# Patient Record
Sex: Female | Born: 1985
Health system: Southern US, Community
[De-identification: ages and names within clinical notes are randomized; demographics above are authoritative.]

## PROBLEM LIST (undated history)

## (undated) DIAGNOSIS — E669 Obesity, unspecified: Secondary | ICD-10-CM

## (undated) DIAGNOSIS — D649 Anemia, unspecified: Secondary | ICD-10-CM

## (undated) DIAGNOSIS — N200 Calculus of kidney: Secondary | ICD-10-CM

## (undated) DIAGNOSIS — N750 Cyst of Bartholin's gland: Secondary | ICD-10-CM

## (undated) DIAGNOSIS — J45909 Unspecified asthma, uncomplicated: Secondary | ICD-10-CM

## (undated) HISTORY — PX: CHOLECYSTECTOMY: SHX55

## (undated) HISTORY — PX: KIDNEY STONE SURGERY: SHX686

---

## 2011-02-25 ENCOUNTER — Emergency Department (HOSPITAL_BASED_OUTPATIENT_CLINIC_OR_DEPARTMENT_OTHER)
Admission: EM | Admit: 2011-02-25 | Discharge: 2011-02-25 | Disposition: A | Discharge status: Home / Self Care | Payer: Self-pay | Attending: Emergency Medicine | Admitting: Emergency Medicine

## 2011-02-25 ENCOUNTER — Encounter: Payer: Self-pay | Admitting: *Deleted

## 2011-02-25 DIAGNOSIS — N390 Urinary tract infection, site not specified: Secondary | ICD-10-CM | POA: Insufficient documentation

## 2011-02-25 DIAGNOSIS — R109 Unspecified abdominal pain: Secondary | ICD-10-CM | POA: Insufficient documentation

## 2011-02-25 DIAGNOSIS — IMO0001 Reserved for inherently not codable concepts without codable children: Secondary | ICD-10-CM | POA: Insufficient documentation

## 2011-02-25 LAB — DIFFERENTIAL
Basophils Relative: 0 % (ref 0–1)
Eosinophils Absolute: 0.2 10*3/uL (ref 0.0–0.7)
Eosinophils Relative: 2 % (ref 0–5)
Monocytes Absolute: 0.7 10*3/uL (ref 0.1–1.0)
Monocytes Relative: 8 % (ref 3–12)
Neutrophils Relative %: 64 % (ref 43–77)

## 2011-02-25 LAB — COMPREHENSIVE METABOLIC PANEL
Albumin: 3.4 g/dL — ABNORMAL LOW (ref 3.5–5.2)
BUN: 8 mg/dL (ref 6–23)
Calcium: 9.4 mg/dL (ref 8.4–10.5)
Creatinine, Ser: 0.5 mg/dL (ref 0.50–1.10)
GFR calc Af Amer: >60 mL/min (ref >60)
Glucose, Bld: 92 mg/dL (ref 70–99)
Total Protein: 7.9 g/dL (ref 6.0–8.3)

## 2011-02-25 LAB — URINALYSIS, ROUTINE W REFLEX MICROSCOPIC
Bilirubin Urine: NEGATIVE
Glucose, UA: NEGATIVE mg/dL
Ketones, ur: NEGATIVE mg/dL
Protein, ur: NEGATIVE mg/dL

## 2011-02-25 LAB — CBC
HCT: 36.4 % (ref 36.0–46.0)
Hemoglobin: 12.4 g/dL (ref 12.0–15.0)
MCH: 29.2 pg (ref 26.0–34.0)
MCHC: 34.1 g/dL (ref 30.0–36.0)
MCV: 85.6 fL (ref 78.0–100.0)

## 2011-02-25 LAB — LIPASE, BLOOD: Lipase: 16 U/L (ref 11–59)

## 2011-02-25 LAB — URINE MICROSCOPIC-ADD ON

## 2011-02-25 MED ORDER — METRONIDAZOLE 500 MG PO TABS: 500.0000 mg | ORAL_TABLET | Freq: Two times a day (BID) | ORAL | Status: AC

## 2011-02-25 MED ORDER — CIPROFLOXACIN HCL 500 MG PO TABS: 500.0000 mg | ORAL_TABLET | Freq: Two times a day (BID) | ORAL | Status: AC

## 2011-02-25 MED ORDER — OXYCODONE-ACETAMINOPHEN 5-325 MG PO TABS
2.0000 | ORAL_TABLET | Freq: Once | ORAL | Status: AC
  Administered 2011-02-25: 2 via ORAL
  Filled 2011-02-25: qty 2

## 2011-02-25 MED ORDER — OXYCODONE-ACETAMINOPHEN 5-325 MG PO TABS: 2.0000 | ORAL_TABLET | ORAL | Status: AC | PRN

## 2011-02-25 MED ORDER — METRONIDAZOLE 500 MG PO TABS
1000.0000 mg | ORAL_TABLET | Freq: Once | ORAL | Status: AC
  Administered 2011-02-25: 1000 mg via ORAL
  Filled 2011-02-25: qty 2

## 2011-02-25 NOTE — ED Provider Notes (Signed)
History    Scribed for No att. providers found, the patient was seen in room MH11/MH11. This chart was scribed by Katha Cabal. This patient's care was started at 7:20PM.   CSN: [id number] Arrival date & time: 02/25/2011  7:12 PM  Chief Complaint  Patient presents with  . Abdominal Pain   Patient is a 25 y.o. female presenting with abdominal pain.  Abdominal Pain The primary symptoms of the illness include abdominal pain.   Sonia Key is a 25 y.o. female who presents to the Emergency Department complaining of moderate constant abdominal pain in all quadrants onset 7AM with associated  Myalgia, and dry heaves.  Denies n/v/d,  Pregnancy (LMP 01/23/11 takes OCP), abnormal vaginal bleeding, dysuria.  Notes cholecystectomy in 2007 with no complications.  Pt is sexually active.     HPI ELEMENTS:  Location: abdominal pain in all 4 quadrants   Onset: 7AM Timing: constant Quality: sharp  Severity: moderate  Context:  as above  Associated symptoms:  Myalgia, and dry heaves.  Denies n/v/d, and pregnancy   PAST MEDICAL HISTORY:  History reviewed. No pertinent past medical history.  PAST SURGICAL HISTORY:  Past Surgical History  Procedure Date  . Cholecystectomy   . Cesarean section     MEDICATIONS:  Previous Medications   ASPIRIN-ACETAMINOPHEN-CAFFEINE (GOODY HEADACHE PO)    Take 1 Package by mouth every 6 (six) hours as needed. pain    ETONOGESTREL (IMPLANON [place])    Inject into the skin once.       ALLERGIES:  Allergies as of 02/25/2011  . (No Known Allergies)     FAMILY HISTORY:  History reviewed. No pertinent family history.   SOCIAL HISTORY: History   Social History  . Marital Status: Single    Spouse Name: N/A    Number of Children: N/A  . Years of Education: N/A   Social History Main Topics  . Smoking status: Never Smoker   . Smokeless tobacco: None  . Alcohol Use: No  . Drug Use:   . Sexually Active: Yes    Birth Control/ Protection: Surgical   Other  Topics Concern  . None   Social History Narrative  . None    Review of Systems  Gastrointestinal: Positive for abdominal pain.   10 Systems reviewed and are negative for acute change except as noted in the HPI.  Physical Exam  BP 143/71  Pulse 62  Temp 97.8 F (36.6 C)  Resp 16  Wt 340 lb (154.223 kg)  SpO2 100%  Physical Exam  Constitutional: She is oriented to person, place, and time. She appears well-developed and well-nourished.  HENT:  Head: Normocephalic and atraumatic.  Neck: Normal range of motion. Neck supple.  Cardiovascular: Normal rate, regular rhythm and normal heart sounds.  Exam reveals no gallop and no friction rub.   No murmur heard. Pulmonary/Chest: Effort normal and breath sounds normal. No respiratory distress. She has no wheezes.  Abdominal: Soft. Bowel sounds are normal. There is tenderness. There is CVA tenderness. There is no rebound and no guarding.       Tenderness in all 4 quadrants   Musculoskeletal: Normal range of motion.  Neurological: She is alert and oriented to person, place, and time.  Skin: Skin is warm and dry.  Psychiatric: She has a normal mood and affect. Her behavior is normal.    ED Course  Procedures OTHER DATA REVIEWED: Nursing notes, vital signs, and past medical records reviewed.    DIAGNOSTIC STUDIES: Oxygen Saturation is  98%  on room air, normal by my interpretation.     LABS / RADIOLOGY:  Results for orders placed during the hospital encounter of 02/25/11  URINALYSIS, ROUTINE W REFLEX MICROSCOPIC      Component Value Range   Color, Urine YELLOW  YELLOW    Appearance CLOUDY (*) CLEAR    Specific Gravity, Urine 1.011  1.005 - 1.030    pH 6.5  5.0 - 8.0    Glucose, UA NEGATIVE  NEGATIVE (mg/dL)   Hgb urine dipstick MODERATE (*) NEGATIVE    Bilirubin Urine NEGATIVE  NEGATIVE    Ketones, ur NEGATIVE  NEGATIVE (mg/dL)   Protein, ur NEGATIVE  NEGATIVE (mg/dL)   Urobilinogen, UA 1.0  0.0 - 1.0 (mg/dL)   Nitrite  NEGATIVE  NEGATIVE    Leukocytes, UA LARGE (*) NEGATIVE   PREGNANCY, URINE      Component Value Range   Preg Test, Ur NEGATIVE    CBC      Component Value Range   WBC 8.8  4.0 - 10.5 (K/uL)   RBC 4.25  3.87 - 5.11 (MIL/uL)   Hemoglobin 12.4  12.0 - 15.0 (g/dL)   HCT 72.5  36.6 - 44.0 (%)   MCV 85.6  78.0 - 100.0 (fL)   MCH 29.2  26.0 - 34.0 (pg)   MCHC 34.1  30.0 - 36.0 (g/dL)   RDW 34.7  42.5 - 95.6 (%)   Platelets 358  150 - 400 (K/uL)  DIFFERENTIAL      Component Value Range   Neutrophils Relative 64  43 - 77 (%)   Neutro Abs 5.6  1.7 - 7.7 (K/uL)   Lymphocytes Relative 26  12 - 46 (%)   Lymphs Abs 2.3  0.7 - 4.0 (K/uL)   Monocytes Relative 8  3 - 12 (%)   Monocytes Absolute 0.7  0.1 - 1.0 (K/uL)   Eosinophils Relative 2  0 - 5 (%)   Eosinophils Absolute 0.2  0.0 - 0.7 (K/uL)   Basophils Relative 0  0 - 1 (%)   Basophils Absolute 0.0  0.0 - 0.1 (K/uL)  COMPREHENSIVE METABOLIC PANEL      Component Value Range   Sodium 139  135 - 145 (mEq/L)   Potassium 3.5  3.5 - 5.1 (mEq/L)   Chloride 103  96 - 112 (mEq/L)   CO2 27  19 - 32 (mEq/L)   Glucose, Bld 92  70 - 99 (mg/dL)   BUN 8  6 - 23 (mg/dL)   Creatinine, Ser 3.87  0.50 - 1.10 (mg/dL)   Calcium 9.4  8.4 - 56.4 (mg/dL)   Total Protein 7.9  6.0 - 8.3 (g/dL)   Albumin 3.4 (*) 3.5 - 5.2 (g/dL)   AST 15  0 - 37 (U/L)   ALT 15  0 - 35 (U/L)   Alkaline Phosphatase 102  39 - 117 (U/L)   Total Bilirubin 0.3  0.3 - 1.2 (mg/dL)   GFR calc non Af Amer >60  >60 (mL/min)   GFR calc Af Amer >60  >60 (mL/min)  LIPASE, BLOOD      Component Value Range   Lipase 16  11 - 59 (U/L)  URINE MICROSCOPIC-ADD ON      Component Value Range   Squamous Epithelial / LPF FEW (*) RARE    WBC, UA 21-50  <3 (WBC/hpf)   RBC / HPF 7-10  <3 (RBC/hpf)   Bacteria, UA FEW (*) RARE    Urine-Other TRICHOMONAS PRESENT  No results found.  No results found.   ED COURSE / COORDINATION OF CARE: Orders Placed This Encounter  Procedures  .  Urinalysis with microscopic  . Pregnancy, urine  . CBC  . Differential  . Comprehensive metabolic panel  . Lipase, blood  . Urine microscopic-add on    MDM: Urine shows trich.  Will treat for this and uti.     IMPRESSION: Diagnoses that have been ruled out:  Diagnoses that are still under consideration:  Final diagnoses:  Abdominal pain, unspecified site  Urinary tract infection    PLAN:  Home Advised to return for worsening or additional problems such as abdominal or chest pain The patient is to return the emergency department if there is any worsening of symptoms. I have reviewed the discharge instructions with the patient.     CONDITION ON DISCHARGE: Good   MEDICATIONS GIVEN IN THE E.D.  Medications  Aspirin-Acetaminophen-Caffeine (GOODY HEADACHE PO) (not administered)  Etonogestrel (IMPLANON [place]) (not administered)  ciprofloxacin (CIPRO) 500 MG tablet (not administered)  metroNIDAZOLE (FLAGYL) 500 MG tablet (not administered)  oxyCODONE-acetaminophen (PERCOCET) 5-325 MG per tablet (not administered)  metroNIDAZOLE (FLAGYL) tablet 1,000 mg (1000 mg Oral Given 02/25/11 2126)  oxyCODONE-acetaminophen (PERCOCET) 5-325 MG per tablet 2 tablet (2 tablet Oral Given 02/25/11 2126)     DISCHARGE MEDICATIONS: New Prescriptions   CIPROFLOXACIN (CIPRO) 500 MG TABLET    Take 1 tablet (500 mg total) by mouth 2 (two) times daily.   METRONIDAZOLE (FLAGYL) 500 MG TABLET    Take 1 tablet (500 mg total) by mouth 2 (two) times daily.   OXYCODONE-ACETAMINOPHEN (PERCOCET) 5-325 MG PER TABLET    Take 2 tablets by mouth every 4 (four) hours as needed for pain.       I personally performed the services described in this documentation, which was scribed in my presence. The recorded information has been reviewed and considered. No att. providers found    Geoffery Lyons, MD 02/25/11 2358

## 2011-02-25 NOTE — ED Notes (Signed)
Pt c/o generalized abd pain . Denies n/v/d

## 2015-06-11 ENCOUNTER — Encounter (HOSPITAL_BASED_OUTPATIENT_CLINIC_OR_DEPARTMENT_OTHER): Payer: Self-pay | Admitting: Emergency Medicine

## 2015-06-11 ENCOUNTER — Emergency Department (HOSPITAL_BASED_OUTPATIENT_CLINIC_OR_DEPARTMENT_OTHER)
Admission: EM | Admit: 2015-06-11 | Discharge: 2015-06-11 | Disposition: A | Discharge status: Home / Self Care | Payer: Self-pay | Attending: Emergency Medicine | Admitting: Emergency Medicine

## 2015-06-11 DIAGNOSIS — Z3202 Encounter for pregnancy test, result negative: Secondary | ICD-10-CM | POA: Insufficient documentation

## 2015-06-11 DIAGNOSIS — B3731 Acute candidiasis of vulva and vagina: Secondary | ICD-10-CM

## 2015-06-11 DIAGNOSIS — N760 Acute vaginitis: Secondary | ICD-10-CM | POA: Insufficient documentation

## 2015-06-11 DIAGNOSIS — B373 Candidiasis of vulva and vagina: Secondary | ICD-10-CM | POA: Insufficient documentation

## 2015-06-11 DIAGNOSIS — B9689 Other specified bacterial agents as the cause of diseases classified elsewhere: Secondary | ICD-10-CM

## 2015-06-11 LAB — PREGNANCY, URINE: PREG TEST UR: NEGATIVE

## 2015-06-11 LAB — URINALYSIS, ROUTINE W REFLEX MICROSCOPIC
Bilirubin Urine: NEGATIVE
GLUCOSE, UA: NEGATIVE mg/dL
Ketones, ur: NEGATIVE mg/dL
Leukocytes, UA: NEGATIVE
Nitrite: NEGATIVE
PH: 6.5 (ref 5.0–8.0)
Protein, ur: NEGATIVE mg/dL
SPECIFIC GRAVITY, URINE: 1.017 (ref 1.005–1.030)

## 2015-06-11 LAB — WET PREP, GENITAL
SPERM: NONE SEEN
Trich, Wet Prep: NONE SEEN

## 2015-06-11 LAB — URINE MICROSCOPIC-ADD ON

## 2015-06-11 MED ORDER — FLUCONAZOLE 100 MG PO TABS
200.0000 mg | ORAL_TABLET | Freq: Once | ORAL | Status: AC
  Administered 2015-06-11: 200 mg via ORAL
  Filled 2015-06-11: qty 2

## 2015-06-11 MED ORDER — METRONIDAZOLE 500 MG PO TABS: 500.0000 mg | ORAL_TABLET | Freq: Two times a day (BID) | ORAL | Status: DC

## 2015-06-11 MED ORDER — CLOTRIMAZOLE 1 % EX CREA: TOPICAL_CREAM | CUTANEOUS | Status: DC

## 2015-06-11 NOTE — ED Provider Notes (Signed)
CSN: [id number]     Arrival date & time 06/11/15  1139 History   First MD Initiated Contact with Patient 06/11/15 1205     Chief Complaint  Patient presents with  . Vaginal Itching     (Consider location/radiation/quality/duration/timing/severity/associated sxs/prior Treatment) HPI  She presents to the emergency department for evaluation of vaginal burning, redness and discharge. Her husband and her try a new kind of condom last night and it broke sometimes within the past week. The patient did not know until white being and saw part of the condom.  The discharge is white creamy. She has not had any fevers, nausea, vomiting, diarrhea, back pain or abdominal pain. Denies having any known allergies and denies that this has happened in the past. The condoms did not have any coating on them intended to cause sensations.    History reviewed. No pertinent past medical history. Past Surgical History  Procedure Laterality Date  . Cholecystectomy    . Cesarean section     History reviewed. No pertinent family history. Social History  Substance Use Topics  . Smoking status: Never Smoker   . Smokeless tobacco: None  . Alcohol Use: No   OB History    No data available     Review of Systems  All other systems reviewed and are negative.   Allergies  Review of patient's allergies indicates no known allergies.  Home Medications   Prior to Admission medications   Medication Sig Start Date End Date Taking? Authorizing Provider  Aspirin-Acetaminophen-Caffeine (GOODY HEADACHE PO) Take 1 Package by mouth every 6 (six) hours as needed. pain     Historical Provider, MD  clotrimazole (LOTRIMIN) 1 % cream Apply to affected area 2 times daily, not to be used internally. Only on external genitalia. 06/11/15   Corina Stacy Neva Seat, PA-C  Etonogestrel (IMPLANON [place]) Inject into the skin once.      Historical Provider, MD  metroNIDAZOLE (FLAGYL) 500 MG tablet Take 1 tablet (500 mg total) by mouth 2 (two)  times daily. 06/11/15   Dalary Hollar Neva Seat, PA-C   BP 123/93 mmHg  Pulse 79  Temp(Src) 97.7 F (36.5 C) (Oral)  Resp 18  Ht  (1.803 m)  Wt 124.739 kg  BMI 38.37 kg/m2  SpO2 100%  LMP 06/01/2015 Physical Exam  Constitutional: She appears well-developed and well-nourished. No distress.  HENT:  Head: Normocephalic and atraumatic.  Eyes: Pupils are equal, round, and reactive to light.  Neck: Normal range of motion. Neck supple.  Cardiovascular: Normal rate and regular rhythm.   Pulmonary/Chest: Effort normal.  Abdominal: Soft.  Genitourinary: There is rash on the right labia. There is no tenderness on the right labia. There is rash on the left labia. There is no tenderness on the left labia. There is erythema in the vagina. No bleeding in the vagina. No foreign body around the vagina. No signs of injury around the vagina. Vaginal discharge (white) found.  Neurological: She is alert.  Skin: Skin is warm and dry.  Nursing note and vitals reviewed.   ED Course  Procedures (including critical care time) Labs Review Labs Reviewed  WET PREP, GENITAL - Abnormal; Notable for the following:    Yeast Wet Prep HPF POC PRESENT (*)    Clue Cells Wet Prep HPF POC PRESENT (*)    WBC, Wet Prep HPF POC MANY (*)    All other components within normal limits  URINALYSIS, ROUTINE W REFLEX MICROSCOPIC (NOT AT [facility]) - Abnormal; Notable for the following:  Hgb urine dipstick TRACE (*)    All other components within normal limits  URINE MICROSCOPIC-ADD ON - Abnormal; Notable for the following:    Squamous Epithelial / LPF 0-5 (*)    Bacteria, UA RARE (*)    All other components within normal limits  PREGNANCY, URINE  GC/CHLAMYDIA PROBE AMP ([place]) [facility]    Imaging Review No results found. I have personally reviewed and evaluated these images and lab results as part of my medical decision-making.   EKG Interpretation None      MDM   Final diagnoses:  Vaginal yeast  infection  Bacterial vaginosis   Wet prep shows positive yeast and clue cells as well as MANY WBC. A dose of Diflucan given in the ED. rx Metronidazole and topical antifungal for itching to external genitalia.  [facility]  Medications  fluconazole (DIFLUCAN) tablet 200 mg (not administered)     I feel the patient has had an appropriate workup for their chief complaint at this time and likelihood of emergent condition existing is low. Discussed s/sx that warrant return to the ED.  Filed Vitals:   06/11/15 1144  BP: 123/93  Pulse: 79  Temp: 97.7 F (36.5 C)  Resp: [address]       Jesusa Stenerson, PA-C 06/11/15 1319  Arby BarretteMarcy Pfeiffer, MD 06/11/15 1530

## 2015-06-11 NOTE — ED Notes (Signed)
Pt reports that she and her husband used a different type of condom, apparently it broke inside her she was unaware, this am tip of condom came out while wiping, now pt has burning  redness and discharge

## 2015-06-11 NOTE — Discharge Instructions (Signed)
Bacterial Vaginosis °Bacterial vaginosis is a vaginal infection that occurs when the normal balance of bacteria in the vagina is disrupted. It results from an overgrowth of certain bacteria. This is the most common vaginal infection in women of childbearing age. Treatment is important to prevent complications, especially in pregnant women, as it can cause a premature delivery. °CAUSES  °Bacterial vaginosis is caused by an increase in harmful bacteria that are normally present in smaller amounts in the vagina. Several different kinds of bacteria can cause bacterial vaginosis. However, the reason that the condition develops is not fully understood. °RISK FACTORS °Certain activities or behaviors can put you at an increased risk of developing bacterial vaginosis, including: °· Having a new sex partner or multiple sex partners. °· Douching. °· Using an intrauterine device (IUD) for contraception. °Women do not get bacterial vaginosis from toilet seats, bedding, swimming pools, or contact with objects around them. °SIGNS AND SYMPTOMS  °Some women with bacterial vaginosis have no signs or symptoms. Common symptoms include: °· Grey vaginal discharge. °· A fishlike odor with discharge, especially after sexual intercourse. °· Itching or burning of the vagina and vulva. °· Burning or pain with urination. °DIAGNOSIS  °Your health care provider will take a medical history and examine the vagina for signs of bacterial vaginosis. A sample of vaginal fluid may be taken. Your health care provider will look at this sample under a microscope to check for bacteria and abnormal cells. A vaginal pH test may also be done.  °TREATMENT  °Bacterial vaginosis may be treated with antibiotic medicines. These may be given in the form of a pill or a vaginal cream. A second round of antibiotics may be prescribed if the condition comes back after treatment. Because bacterial vaginosis increases your risk for sexually transmitted diseases, getting  treated can help reduce your risk for chlamydia, gonorrhea, HIV, and herpes. °HOME CARE INSTRUCTIONS  °· Only take over-the-counter or prescription medicines as directed by your health care provider. °· If antibiotic medicine was prescribed, take it as directed. Make sure you finish it even if you start to feel better. °· Tell all sexual partners that you have a vaginal infection. They should see their health care provider and be treated if they have problems, such as a mild rash or itching. °· During treatment, it is important that you follow these instructions: °¨ Avoid sexual activity or use condoms correctly. °¨ Do not douche. °¨ Avoid alcohol as directed by your health care provider. °¨ Avoid breastfeeding as directed by your health care provider. °SEEK MEDICAL CARE IF:  °· Your symptoms are not improving after 3 days of treatment. °· You have increased discharge or pain. °· You have a fever. °MAKE SURE YOU:  °· Understand these instructions. °· Will watch your condition. °· Will get help right away if you are not doing well or get worse. °FOR MORE INFORMATION  °Centers for Disease Control and Prevention, Division of STD Prevention: www.cdc.gov/std °American Sexual Health Association (ASHA): www.ashastd.org  °  °This information is not intended to replace advice given to you by your health care provider. Make sure you discuss any questions you have with your health care provider. °  °Document Released: 06/24/2005 Document Revised: 07/15/2014 Document Reviewed: 02/03/2013 °Elsevier Interactive Patient Education ©2016 [employer] ° ° ° °Vaginitis °Vaginitis is an inflammation of the vagina. It is most often caused by a change in the normal balance of the bacteria and yeast that live in the vagina. This change in balance   causes an overgrowth of certain bacteria or yeast, which causes the inflammation. There are different types of vaginitis, but the most common types are: °· Bacterial vaginosis. °· Yeast  infection (candidiasis). °· Trichomoniasis vaginitis. This is a sexually transmitted infection (STI). °· Viral vaginitis. °· Atrophic vaginitis. °· Allergic vaginitis. °CAUSES  °The cause depends on the type of vaginitis. Vaginitis can be caused by: °· Bacteria (bacterial vaginosis). °· Yeast (yeast infection). °· A parasite (trichomoniasis vaginitis) °· A virus (viral vaginitis). °· Low hormone levels (atrophic vaginitis). Low hormone levels can occur during pregnancy, breastfeeding, or after menopause. °· Irritants, such as bubble baths, scented tampons, and feminine sprays (allergic vaginitis). °Other factors can change the normal balance of the yeast and bacteria that live in the vagina. These include: °· Antibiotic medicines. °· Poor hygiene. °· Diaphragms, vaginal sponges, spermicides, birth control pills, and intrauterine devices (IUD). °· Sexual intercourse. °· Infection. °· Uncontrolled diabetes. °· A weakened immune system. °SYMPTOMS  °Symptoms can vary depending on the cause of the vaginitis. Common symptoms include: °· Abnormal vaginal discharge. °¨ The discharge is white, gray, or yellow with bacterial vaginosis. °¨ The discharge is thick, white, and cheesy with a yeast infection. °¨ The discharge is frothy and yellow or greenish with trichomoniasis. °· A bad vaginal odor. °¨ The odor is fishy with bacterial vaginosis. °· Vaginal itching, pain, or swelling. °· Painful intercourse. °· Pain or burning when urinating. °Sometimes, there are no symptoms. °TREATMENT  °Treatment will vary depending on the type of infection.  °· Bacterial vaginosis and trichomoniasis are often treated with antibiotic creams or pills. °· Yeast infections are often treated with antifungal medicines, such as vaginal creams or suppositories. °· Viral vaginitis has no cure, but symptoms can be treated with medicines that relieve discomfort. Your sexual partner should be treated as well. °· Atrophic vaginitis may be treated with an  estrogen cream, pill, suppository, or vaginal ring. If vaginal dryness occurs, lubricants and moisturizing creams may help. You may be told to avoid scented soaps, sprays, or douches. °· Allergic vaginitis treatment involves quitting the use of the product that is causing the problem. Vaginal creams can be used to treat the symptoms. °HOME CARE INSTRUCTIONS  °· Take all medicines as directed by your caregiver. °· Keep your genital area clean and dry. Avoid soap and only rinse the area with water. °· Avoid douching. It can remove the healthy bacteria in the vagina. °· Do not use tampons or have sexual intercourse until your vaginitis has been treated. Use sanitary pads while you have vaginitis. °· Wipe from front to back. This avoids the spread of bacteria from the rectum to the vagina. °· Let air reach your genital area. °¨ Wear cotton underwear to decrease moisture buildup. °¨ Avoid wearing underwear while you sleep until your vaginitis is gone. °¨ Avoid tight pants and underwear or nylons without a cotton panel. °¨ Take off wet clothing (especially bathing suits) as soon as possible. °· Use mild, non-scented products. Avoid using irritants, such as: °¨ Scented feminine sprays. °¨ Fabric softeners. °¨ Scented detergents. °¨ Scented tampons. °¨ Scented soaps or bubble baths. °· Practice safe sex and use condoms. Condoms may prevent the spread of trichomoniasis and viral vaginitis. °SEEK MEDICAL CARE IF:  °· You have abdominal pain. °· You have a fever or persistent symptoms for more than 2-3 days. °· You have a fever and your symptoms suddenly get worse. °  °This information is not intended to replace advice given   to you by your health care provider. Make sure you discuss any questions you have with your health care provider. °  °Document Released: 04/21/2007 Document Revised: 11/08/2014 Document Reviewed: 12/05/2011 °Elsevier Interactive Patient Education ©2016 [employer] Inc. ° °

## 2015-06-12 LAB — GC/CHLAMYDIA PROBE AMP (~~LOC~~) NOT AT ARMC
Chlamydia: NEGATIVE
NEISSERIA GONORRHEA: NEGATIVE

## 2015-12-31 ENCOUNTER — Emergency Department (HOSPITAL_BASED_OUTPATIENT_CLINIC_OR_DEPARTMENT_OTHER)
Admission: EM | Admit: 2015-12-31 | Discharge: 2015-12-31 | Disposition: A | Discharge status: Home / Self Care | Payer: Self-pay | Attending: Emergency Medicine | Admitting: Emergency Medicine

## 2015-12-31 ENCOUNTER — Encounter (HOSPITAL_BASED_OUTPATIENT_CLINIC_OR_DEPARTMENT_OTHER): Payer: Self-pay | Admitting: *Deleted

## 2015-12-31 DIAGNOSIS — N751 Abscess of Bartholin's gland: Secondary | ICD-10-CM | POA: Insufficient documentation

## 2015-12-31 DIAGNOSIS — Z7982 Long term (current) use of aspirin: Secondary | ICD-10-CM | POA: Insufficient documentation

## 2015-12-31 DIAGNOSIS — F1290 Cannabis use, unspecified, uncomplicated: Secondary | ICD-10-CM | POA: Insufficient documentation

## 2015-12-31 MED ORDER — LIDOCAINE-EPINEPHRINE (PF) 2 %-1:200000 IJ SOLN
10.0000 mL | Freq: Once | INTRAMUSCULAR | Status: AC
  Administered 2015-12-31: 10 mL
  Filled 2015-12-31: qty 10

## 2015-12-31 NOTE — ED Notes (Signed)
Pt reports a L lower labial abscess x2wks that's gradually gotten worse. Denies drainage, fever, n/v/d. Denies known exposure to STDs.

## 2015-12-31 NOTE — ED Provider Notes (Signed)
CSN: [id number]     Arrival date & time 12/31/15  [id number] History   First MD [id number] Contact with Patient 12/31/15 0901     Chief Complaint  Patient presents with  . Abscess     (Consider location/radiation/quality/duration/timing/severity/associated sxs/prior Treatment) HPI   Patient is a 30 year old female with no pertinent past medical history presents the ED with complaint of labial abscess, onset 2 weeks. Patient reports after shaving 2 weeks ago she began to have an ingrown hair which she notes has increased in size over the past week. Endorses associated pain. Denies fever, chills, drainage, dysuria, vaginal d/c, vaginal bleeding.  History reviewed. No pertinent past medical history. Past Surgical History  Procedure Laterality Date  . Cholecystectomy    . Cesarean section     No family history on file. Social History  Substance Use Topics  . Smoking status: Never Smoker   . Smokeless tobacco: Never Used  . Alcohol Use: Yes     Comment: once a month   OB History    No data available     Review of Systems  Constitutional: Negative for fever.  Gastrointestinal: Negative for nausea, vomiting and abdominal pain.  Genitourinary: Negative for dysuria, vaginal bleeding and vaginal discharge.  Skin: Wound: abscess.      Allergies  Review of patient's allergies indicates no known allergies.  Home Medications   Prior to Admission medications   Medication Sig Start Date End Date Taking? Authorizing Provider  Aspirin-Acetaminophen-Caffeine (GOODY HEADACHE PO) Take 1 Package by mouth every 6 (six) hours as needed. pain    Yes Historical Provider, MD  clotrimazole (LOTRIMIN) 1 % cream Apply to affected area 2 times daily, not to be used internally. Only on external genitalia. 06/11/15   Tiffany Neva SeatGreene, PA-C  Etonogestrel (IMPLANON [place]) Inject into the skin once.      Historical Provider, MD  metroNIDAZOLE (FLAGYL) 500 MG tablet Take 1 tablet (500 mg total) by mouth 2 (two)  times daily. 06/11/15   Tiffany Neva SeatGreene, PA-C   BP 129/74 mmHg  Pulse 66  Temp(Src) 98 F (36.7 C) (Oral)  Resp 20  Ht 5\' 11"  (1.803 m)  Wt 129.275 kg  BMI 39.77 kg/m2  SpO2 99%  LMP 12/09/2015 Physical Exam  Constitutional: She is oriented to person, place, and time. She appears well-developed and well-nourished.  HENT:  Head: Normocephalic and atraumatic.  Eyes: Conjunctivae and EOM are normal. Right eye exhibits no discharge. Left eye exhibits no discharge. No scleral icterus.  Neck: Normal range of motion. Neck supple.  Cardiovascular: Normal rate.   Pulmonary/Chest: Effort normal.  Abdominal: Soft. She exhibits no distension.  Genitourinary:     Musculoskeletal: Normal range of motion.  Neurological: She is alert and oriented to person, place, and time.  Skin: Skin is warm and dry.  Nursing note and vitals reviewed.   ED Course  .Marland Kitchen.Incision and Drainage Date/Time: 12/31/2015 9:55 AM Performed by: Barrett HenleNADEAU, NICOLE ELIZABETH Authorized by: Barrett HenleNADEAU, NICOLE ELIZABETH Consent: Verbal consent obtained. Risks and benefits: risks, benefits and alternatives were discussed Consent given by: patient Patient understanding: patient states understanding of the procedure being performed Patient identity confirmed: verbally with patient Type: abscess Body area: anogenital Location details: Bartholin's gland Anesthesia: local infiltration Local anesthetic: lidocaine 2% with epinephrine Anesthetic total: 3 ml Scalpel size: 11 Needle gauge: 25. Incision type: single straight Incision depth: dermal Complexity: simple Drainage: purulent Drainage amount: moderate Wound treatment: wound left open Packing material: 1/2 in gauze Patient tolerance: Patient tolerated the  procedure well with no immediate complications   (including critical care time) Labs Review Labs Reviewed - No data to display  Imaging Review No results found. I have personally reviewed and evaluated these images  and lab results as part of my medical decision-making.   EKG Interpretation None      MDM   Final diagnoses:  Bartholin's gland abscess    Patient with bartholin's abscess amenable to incision and drainage.  Abscess was not large enough to warrant packing or drain,  wound recheck in 2 days. Encouraged home warm soaks and flushing.  Mild signs of cellulitis is surrounding skin.  Will d/c to home.  No antibiotic therapy is indicated. Discussed return precautions with patient.    Satira Sarkicole Elizabeth WedoweeNadeau, [place] JerseyPA-C 12/31/15 [phone]  Rolan BuccoMelanie Belfi, MD 12/31/15 1034

## 2015-12-31 NOTE — Discharge Instructions (Signed)
Keep ear wounds clean using Dial antibacterial supper and water, pat dry. I recommend continuing to flush the wound with water after bathing and keep the wound clean after urinating. I recommend continuing to apply warm compresses to wound for 15-20 minutes 3-4 times daily. Please follow up with a primary care provider from the Resource Guide provided below in 4-5 days if your symptoms have not improved. Please return to the Emergency Department if symptoms worsen or new onset of fever, abdominal pain, pain with urinating, vaginal discharge, drainage, swelling, redness.

## 2016-02-06 ENCOUNTER — Emergency Department (HOSPITAL_BASED_OUTPATIENT_CLINIC_OR_DEPARTMENT_OTHER)
Admission: EM | Admit: 2016-02-06 | Discharge: 2016-02-06 | Disposition: A | Discharge status: Home / Self Care | Payer: Self-pay | Attending: Emergency Medicine | Admitting: Emergency Medicine

## 2016-02-06 ENCOUNTER — Encounter (HOSPITAL_BASED_OUTPATIENT_CLINIC_OR_DEPARTMENT_OTHER): Payer: Self-pay | Admitting: *Deleted

## 2016-02-06 DIAGNOSIS — J029 Acute pharyngitis, unspecified: Secondary | ICD-10-CM | POA: Insufficient documentation

## 2016-02-06 LAB — RAPID STREP SCREEN (MED CTR MEBANE ONLY): Streptococcus, Group A Screen (Direct): NEGATIVE

## 2016-02-06 MED ORDER — IBUPROFEN 800 MG PO TABS: 800.0000 mg | ORAL_TABLET | Freq: Three times a day (TID) | ORAL | 0 refills | Status: DC | PRN

## 2016-02-06 MED ORDER — KETOROLAC TROMETHAMINE 30 MG/ML IJ SOLN
30.0000 mg | Freq: Once | INTRAMUSCULAR | Status: AC
  Administered 2016-02-06: 30 mg via INTRAVENOUS
  Filled 2016-02-06: qty 1

## 2016-02-06 MED ORDER — HYDROCODONE-ACETAMINOPHEN 7.5-325 MG/15ML PO SOLN: 10.0000 mL | Freq: Four times a day (QID) | ORAL | 0 refills | Status: DC | PRN

## 2016-02-06 MED ORDER — SODIUM CHLORIDE 0.9 % IV BOLUS (SEPSIS)
1000.0000 mL | Freq: Once | INTRAVENOUS | Status: AC
  Administered 2016-02-06: 1000 mL via INTRAVENOUS

## 2016-02-06 MED FILL — IBUPROFEN 800 MG TABLET: 800 | 7 days supply | Qty: 21 | Fill #0

## 2016-02-06 MED FILL — HYDROCOD-APAP 7.5-325/15ML: 7.5-325 | 3 days supply | Qty: 100 | Fill #0

## 2016-02-06 NOTE — Discharge Instructions (Signed)
Read the information below.  Use the prescribed medication as directed.  Please discuss all new medications with your pharmacist.  Do not take additional tylenol while taking the prescribed pain medication to avoid overdose.  You may return to the Emergency Department at any time for worsening condition or any new symptoms that concern you.  If you develop high fevers, difficulty swallowing or breathing, or you are unable to tolerate fluids by mouth, return to the ER immediately for a recheck.    °

## 2016-02-06 NOTE — ED Triage Notes (Signed)
Sore throat x 3 days. Ears and eyes hurt.

## 2016-02-06 NOTE — ED Provider Notes (Signed)
[facility]   CSN: [id number] Arrival date & time: 02/06/16  1032  First Provider Contact:  First MD Initiated Contact with Patient 02/06/16 1040        History   Chief Complaint No chief complaint on file.   HPI Sonia Key is a 30 y.o. female.  HPI   Patient presents with 3 days of sore throat, headache, bilateral ear pain, chills, myalgias.  Took goody powder this morning without relief.  Is attempting to stay hydrated but has had decreased PO intake.  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    No past medical history on file.  There are no active problems to display for this patient.   Past Surgical History:  Procedure Laterality Date  . CESAREAN SECTION    . CHOLECYSTECTOMY      OB History    No data available       Home Medications    Prior to Admission medications   Medication Sig Start Date End Date Taking? Authorizing Provider  Aspirin-Acetaminophen-Caffeine (GOODY HEADACHE PO) Take 1 Package by mouth every 6 (six) hours as needed. pain     Historical Provider, MD  clotrimazole (LOTRIMIN) 1 % cream Apply to affected area 2 times daily, not to be used internally. Only on external genitalia. 06/11/15   Tiffany Neva Seat, PA-C  Etonogestrel (IMPLANON [place]) Inject into the skin once.      Historical Provider, MD  metroNIDAZOLE (FLAGYL) 500 MG tablet Take 1 tablet (500 mg total) by mouth 2 (two) times daily. 06/11/15   Marlon Pel, PA-C    Family History No family history on file.  Social History Social History  Substance Use Topics  . Smoking status: Never Smoker  . Smokeless tobacco: Never Used  . Alcohol use Yes     Comment: once a month     Allergies   Review of patient's allergies indicates no known allergies.   Review of Systems Review of Systems  All other systems reviewed and are negative.    Physical Exam Updated Vital Signs BP 143/76 (BP Location: Left Arm)   Pulse 65   Temp  98 F (36.7 C) (Oral)   Resp 18   Ht 5\' 10"  (1.778 m)   Wt 129.3 kg   SpO2 100%   BMI 40.89 kg/m   Physical Exam  Constitutional: She appears well-developed and well-nourished. No distress.  HENT:  Head: Normocephalic and atraumatic.  Mouth/Throat: Uvula is midline. Mucous membranes are not dry. Oropharyngeal exudate, posterior oropharyngeal edema and posterior oropharyngeal erythema present. No tonsillar abscesses.  Eyes: Conjunctivae are normal.  Neck: Neck supple.  Cardiovascular: Normal rate and regular rhythm.   Pulmonary/Chest: Effort normal and breath sounds normal. No stridor. No respiratory distress. She has no wheezes. She has no rales.  Lymphadenopathy:    She has cervical adenopathy.  Neurological: She is alert.  Skin: She is not diaphoretic.  Nursing note and vitals reviewed.    ED Treatments / Results  Labs (all labs ordered are listed, but only abnormal results are displayed) Labs Reviewed  RAPID STREP SCREEN (NOT AT [facility])  CULTURE, GROUP A STREP [facility])    EKG  EKG Interpretation None       Radiology No results found.  Procedures Procedures (including critical care time)  Medications Ordered in ED Medications  sodium chloride 0.9 % bolus 1,000 mL (1,000 mLs Intravenous New Bag/Given 02/06/16 1128)  ketorolac (TORADOL) 30 MG/ML injection 30  mg (30 mg Intravenous Given 02/06/16 1131)     Initial Impression / Assessment and Plan / ED Course  I have reviewed the triage vital signs and the nursing notes.  Pertinent labs & imaging results that were available during my care of the patient were reviewed by me and considered in my medical decision making (see chart for details).  Clinical Course    Afebrile, nontoxic patient with sore throat.  Strep screen negative, culture pending.  Easily tolerating oral secretions.  No airway concerns.  Doubt peritonsillar abscess.   D/C home with pain medication, PCP follow up.  Discussed result, findings,  treatment, and follow up  with patient.  Pt given return precautions.  Pt verbalizes understanding and agrees with plan.       Final Clinical Impressions(s) / ED Diagnoses   Final diagnoses:  Pharyngitis    New Prescriptions New Prescriptions   HYDROCODONE-ACETAMINOPHEN (HYCET) 7.5-325 MG/15 ML SOLUTION    Take 10 mLs by mouth 4 (four) times daily as needed for severe pain.   IBUPROFEN (ADVIL,MOTRIN) 800 MG TABLET    Take 1 tablet (800 mg total) by mouth every 8 (eight) hours as needed.     Trixie Dredge, PA-C 02/06/16 1226    Vanetta Mulders, MD 02/09/16 [phone]

## 2016-02-07 LAB — CULTURE, GROUP A STREP (THRC)

## 2016-03-13 ENCOUNTER — Encounter (HOSPITAL_BASED_OUTPATIENT_CLINIC_OR_DEPARTMENT_OTHER): Payer: Self-pay | Admitting: *Deleted

## 2016-03-13 ENCOUNTER — Emergency Department (HOSPITAL_BASED_OUTPATIENT_CLINIC_OR_DEPARTMENT_OTHER)
Admission: EM | Admit: 2016-03-13 | Discharge: 2016-03-13 | Disposition: A | Discharge status: Home / Self Care | Payer: BLUE CROSS/BLUE SHIELD | Attending: Emergency Medicine | Admitting: Emergency Medicine

## 2016-03-13 DIAGNOSIS — R109 Unspecified abdominal pain: Secondary | ICD-10-CM | POA: Diagnosis not present

## 2016-03-13 DIAGNOSIS — Z7982 Long term (current) use of aspirin: Secondary | ICD-10-CM | POA: Diagnosis not present

## 2016-03-13 DIAGNOSIS — R197 Diarrhea, unspecified: Secondary | ICD-10-CM | POA: Diagnosis not present

## 2016-03-13 HISTORY — DX: Calculus of kidney: N20.0

## 2016-03-13 LAB — URINALYSIS, ROUTINE W REFLEX MICROSCOPIC
Bilirubin Urine: NEGATIVE
GLUCOSE, UA: NEGATIVE mg/dL
HGB URINE DIPSTICK: NEGATIVE
KETONES UR: NEGATIVE mg/dL
Leukocytes, UA: NEGATIVE
Nitrite: NEGATIVE
PROTEIN: NEGATIVE mg/dL
Specific Gravity, Urine: 1.015 (ref 1.005–1.030)
pH: 6 (ref 5.0–8.0)

## 2016-03-13 LAB — COMPREHENSIVE METABOLIC PANEL
ALK PHOS: 81 U/L (ref 38–126)
ALT: 16 U/L (ref 14–54)
ANION GAP: 9 (ref 5–15)
AST: 21 U/L (ref 15–41)
Albumin: 4 g/dL (ref 3.5–5.0)
BUN: 8 mg/dL (ref 6–20)
CALCIUM: 8.8 mg/dL — AB (ref 8.9–10.3)
CO2: 29 mmol/L (ref 22–32)
Chloride: 101 mmol/L (ref 101–111)
Creatinine, Ser: 0.64 mg/dL (ref 0.44–1.00)
Glucose, Bld: 95 mg/dL (ref 65–99)
Potassium: 3.2 mmol/L — ABNORMAL LOW (ref 3.5–5.1)
SODIUM: 139 mmol/L (ref 135–145)
TOTAL PROTEIN: 7.8 g/dL (ref 6.5–8.1)
Total Bilirubin: 0.5 mg/dL (ref 0.3–1.2)

## 2016-03-13 LAB — CBC
HCT: 38.5 % (ref 36.0–46.0)
HEMOGLOBIN: 13 g/dL (ref 12.0–15.0)
MCH: 31 pg (ref 26.0–34.0)
MCHC: 33.8 g/dL (ref 30.0–36.0)
MCV: 91.7 fL (ref 78.0–100.0)
Platelets: 292 10*3/uL (ref 150–400)
RBC: 4.2 MIL/uL (ref 3.87–5.11)
RDW: 12.4 % (ref 11.5–15.5)
WBC: 5.9 10*3/uL (ref 4.0–10.5)

## 2016-03-13 LAB — OCCULT BLOOD X 1 CARD TO LAB, STOOL: FECAL OCCULT BLD: NEGATIVE

## 2016-03-13 LAB — PREGNANCY, URINE: Preg Test, Ur: NEGATIVE

## 2016-03-13 NOTE — ED Notes (Signed)
Unable to obtain d/c VS. Pt states [facility].

## 2016-03-13 NOTE — ED Notes (Signed)
Pt given d/c instructions as per chart. Verbalizes understanding. No questions. 

## 2016-03-13 NOTE — ED Provider Notes (Signed)
[facility]   CSN: [id number] Arrival date & time: 03/13/16  1728     History   Chief [id number] Chief Complaint  Patient presents with  . Abdominal Pain    HPI   Sonia Key is a 30 y.o. Female with pmhx of kidney stools presenting with abdominal pain and dark stools. States two weeks ago, patient had a rotator cuff injury. Patient was seen on Friday for her rotator cuff injury last Thursday and received  Toradol injection. She received a prescription for tramadol and took 2 pills, she did not like how it made her feel so she stopped this medication. On Friday or Saturday, patient was noted having black stools. Patient then had black stool again yesterday and today. Patient indicated having intermittent LUQ abdominal pain, not currently with pain. Not associated with food. Indicates having Ibuprofen x 1, however states this hurt her stomach and there she has not taken any other NSAIDs. Denies ETOH or tobacco uses. No bright red blood in the stools. Endorses lightheadness and fatigue. Denies palpation or SOB.       Past Medical History:  Diagnosis Date  . Kidney stones     There are no active problems to display for this patient.   Past Surgical History:  Procedure Laterality Date  . CESAREAN SECTION    . CHOLECYSTECTOMY    . KIDNEY STONE SURGERY      OB History    No data available       Home Medications    Prior to Admission medications   Medication Sig Start Date End Date Taking? Authorizing Provider  Aspirin-Acetaminophen-Caffeine (GOODY HEADACHE PO) Take 1 Package by mouth every 6 (six) hours as needed. pain     Historical Provider, MD  clotrimazole (LOTRIMIN) 1 % cream Apply to affected area 2 times daily, not to be used internally. Only on external genitalia. 06/11/15   Tiffany Neva SeatGreene, PA-C  Etonogestrel (IMPLANON [place]) Inject into the skin once.      Historical Provider, MD  HYDROcodone-acetaminophen (HYCET) 7.5-325 mg/15 ml  solution Take 10 mLs by mouth 4 (four) times daily as needed for severe pain. 02/06/16   Trixie DredgeEmily West, PA-C  ibuprofen (ADVIL,MOTRIN) 800 MG tablet Take 1 tablet (800 mg total) by mouth every 8 (eight) hours as needed. 02/06/16   Trixie DredgeEmily West, PA-C  metroNIDAZOLE (FLAGYL) 500 MG tablet Take 1 tablet (500 mg total) by mouth 2 (two) times daily. 06/11/15   Marlon Peliffany Greene, PA-C    Family History No family history on file.  Social History Social History  Substance Use Topics  . Smoking status: Never Smoker  . Smokeless tobacco: Never Used  . Alcohol use Yes     Comment: once a month     Allergies   Review of patient's allergies indicates no known allergies.   Review of Systems Review of Systems  Constitutional: Negative for chills and fever.  Eyes: Negative for visual disturbance.  Respiratory: Negative for cough and shortness of breath.   Cardiovascular: Negative for chest pain and palpitations.  Gastrointestinal: Positive for abdominal pain and diarrhea. Negative for blood in stool, nausea and vomiting.  Genitourinary: Negative for dysuria, frequency and urgency.     Physical Exam Updated Vital Signs BP (!) 150/102 (BP Location: Right Arm)   Pulse 68   Temp 98.2 F (36.8 C) (Oral)   Resp 20   Ht 5\' 10"  (1.778 m)   Wt 130.6 kg   LMP 03/07/2016   SpO2 100%  BMI 41.32 kg/m   Physical Exam  Constitutional: She is oriented to person, place, and time. She appears well-developed and well-nourished.  HENT:  Head: Normocephalic and atraumatic.  Eyes: Conjunctivae are normal. Right eye exhibits no discharge. Left eye exhibits no discharge.  Neck: Normal range of motion. Neck supple.  Cardiovascular: Normal rate, regular rhythm, normal heart sounds and intact distal pulses.   Pulmonary/Chest: Effort normal and breath sounds normal.  Abdominal: Soft. Bowel sounds are normal. There is no tenderness. There is no guarding.  Musculoskeletal: Normal range of motion.  Neurological: She  is alert and oriented to person, place, and time.  Skin: Skin is warm and dry.     ED Treatments / Results  Labs (all labs ordered are listed, but only abnormal results are displayed) Labs Reviewed  COMPREHENSIVE METABOLIC PANEL - Abnormal; Notable for the following:       Result Value   Potassium 3.2 (*)    Calcium 8.8 (*)    All other components within normal limits  URINALYSIS, ROUTINE W REFLEX MICROSCOPIC (NOT AT [facility])  PREGNANCY, URINE  CBC  OCCULT BLOOD X 1 CARD TO LAB, STOOL    EKG  EKG Interpretation None       Radiology No results found.  Procedures Procedures (including critical care time)  Medications Ordered in ED Medications - No data to display   Initial Impression / Assessment and Plan / ED Course  I have reviewed the triage vital signs and the nursing notes.  Pertinent labs & imaging results that were available during my care of the patient were reviewed by me and considered in my medical decision making (see chart for details).  Clinical Course   She presented with complaint of melena. Vitals are stable. Negative Hemoccult, stable hemoglobin, BUN within normal limits therefore no concern for GI bleed at this point. Discussed this with patient and she agreed that if she were to have worsening symptoms anemia or continued dark stools, return for follow up.    Final Clinical Impressions(s) / ED Diagnoses   Final diagnoses:  Abdominal pain, unspecified abdominal location    New Prescriptions Discharge Medication List as of 03/13/2016  7:28 PM       Hamid Brookens Mayra Reel, MD 03/13/16 2209    Berton Bon, MD 03/13/16 5366    Rolland Porter, MD 03/24/16 [phone]

## 2016-03-13 NOTE — ED Triage Notes (Signed)
Reports she had toradol IM last week for rotator cuff injury then was started on tramadol. Since then has been having black stools, diarrhea (reports 6 episodes in last 24 hours0, and abd pain. Denies recent antibiotic use

## 2016-03-13 NOTE — Discharge Instructions (Signed)
All your results were negative for signs of bleeding. If you continue to have black stools over next several days or have shortness of breath, palpations, worsening lightheadedness, please come back to be re-evaluated.

## 2016-10-17 ENCOUNTER — Encounter (HOSPITAL_BASED_OUTPATIENT_CLINIC_OR_DEPARTMENT_OTHER): Payer: Self-pay | Admitting: Emergency Medicine

## 2016-10-17 ENCOUNTER — Emergency Department (HOSPITAL_BASED_OUTPATIENT_CLINIC_OR_DEPARTMENT_OTHER)
Admission: EM | Admit: 2016-10-17 | Discharge: 2016-10-17 | Disposition: A | Discharge status: Home / Self Care | Payer: BLUE CROSS/BLUE SHIELD | Attending: Emergency Medicine | Admitting: Emergency Medicine

## 2016-10-17 DIAGNOSIS — N764 Abscess of vulva: Secondary | ICD-10-CM | POA: Insufficient documentation

## 2016-10-17 DIAGNOSIS — Z7982 Long term (current) use of aspirin: Secondary | ICD-10-CM | POA: Insufficient documentation

## 2016-10-17 DIAGNOSIS — N762 Acute vulvitis: Secondary | ICD-10-CM | POA: Insufficient documentation

## 2016-10-17 DIAGNOSIS — L0390 Cellulitis, unspecified: Secondary | ICD-10-CM

## 2016-10-17 HISTORY — DX: Cyst of Bartholin's gland: N75.0

## 2016-10-17 HISTORY — DX: Obesity, unspecified: E66.9

## 2016-10-17 MED ORDER — SULFAMETHOXAZOLE-TRIMETHOPRIM 800-160 MG PO TABS
1.0000 | ORAL_TABLET | Freq: Once | ORAL | Status: AC
  Administered 2016-10-17: 1 via ORAL
  Filled 2016-10-17: qty 1

## 2016-10-17 MED ORDER — SULFAMETHOXAZOLE-TRIMETHOPRIM 800-160 MG PO TABS: 1.0000 | ORAL_TABLET | Freq: Two times a day (BID) | ORAL | 0 refills | Status: AC

## 2016-10-17 MED ORDER — LIDOCAINE-EPINEPHRINE 1 %-1:100000 IJ SOLN
INTRAMUSCULAR | Status: AC
  Administered 2016-10-17: 1 mL
  Filled 2016-10-17: qty 1

## 2016-10-17 MED ORDER — NAPROXEN 250 MG PO TABS
500.0000 mg | ORAL_TABLET | Freq: Once | ORAL | Status: AC
  Administered 2016-10-17: 500 mg via ORAL
  Filled 2016-10-17: qty 2

## 2016-10-17 MED ORDER — FLUCONAZOLE 150 MG PO TABS: 150.0000 mg | ORAL_TABLET | Freq: Once | ORAL | 0 refills | Status: AC

## 2016-10-17 MED ORDER — NAPROXEN 375 MG PO TABS: 375.0000 mg | ORAL_TABLET | Freq: Two times a day (BID) | ORAL | 0 refills | Status: DC

## 2016-10-17 NOTE — ED Provider Notes (Signed)
[facility]   CSN: [id number] Arrival date & time: 10/17/16  0159     History   Chief Complaint Chief Complaint  Patient presents with  . Abscess    HPI Sonia Key is a 31 y.o. female.  The history is provided by the patient.  Abscess  Location:  Pelvis Pelvic abscess location:  Vulva Abscess quality: painful   Red streaking: no   Duration:  6 months Progression:  Unchanged Pain details:    Quality:  Aching   Severity:  Moderate   Timing:  Constant   Progression:  Worsening Chronicity:  Chronic Context: not diabetes   Relieved by:  Nothing Worsened by:  Nothing Ineffective treatments:  None tried Associated symptoms: no anorexia and no fever   Risk factors: prior abscess   States she was seen for a Bartholins abscess 6 months ago and told this one did not need to be drained.    Past Medical History:  Diagnosis Date  . Bartholin cyst   . Kidney stones   . Obesity     There are no active problems to display for this patient.   Past Surgical History:  Procedure Laterality Date  . CESAREAN SECTION    . CHOLECYSTECTOMY    . KIDNEY STONE SURGERY      OB History    No data available       Home Medications    Prior to Admission medications   Medication Sig Start Date End Date Taking? Authorizing Provider  Aspirin-Acetaminophen-Caffeine (GOODY HEADACHE PO) Take 1 Package by mouth every 6 (six) hours as needed. pain     Historical Provider, MD  clotrimazole (LOTRIMIN) 1 % cream Apply to affected area 2 times daily, not to be used internally. Only on external genitalia. 06/11/15   Tiffany Neva Seat, PA-C  Etonogestrel (IMPLANON [place]) Inject into the skin once.      Historical Provider, MD  fluconazole (DIFLUCAN) 150 MG tablet Take 1 tablet (150 mg total) by mouth once. 10/17/16 10/17/16  Rianne Degraaf, MD  HYDROcodone-acetaminophen (HYCET) 7.5-325 mg/15 ml solution Take 10 mLs by mouth 4 (four) times daily as needed for severe pain.  02/06/16   Trixie Dredge, PA-C  ibuprofen (ADVIL,MOTRIN) 800 MG tablet Take 1 tablet (800 mg total) by mouth every 8 (eight) hours as needed. 02/06/16   Trixie Dredge, PA-C  metroNIDAZOLE (FLAGYL) 500 MG tablet Take 1 tablet (500 mg total) by mouth 2 (two) times daily. 06/11/15   Tiffany Neva Seat, PA-C  naproxen (NAPROSYN) 375 MG tablet Take 1 tablet (375 mg total) by mouth 2 (two) times daily. 10/17/16   Hillis Mcphatter, MD  sulfamethoxazole-trimethoprim (BACTRIM DS,SEPTRA DS) 800-160 MG tablet Take 1 tablet by mouth 2 (two) times daily. 10/17/16 10/24/16  Elai Vanwyk, MD    Family History No family history on file.  Social History Social History  Substance Use Topics  . Smoking status: Never Smoker  . Smokeless tobacco: Never Used  . Alcohol use Yes     Comment: once a month     Allergies   Patient has no known allergies.   Review of Systems Review of Systems  Constitutional: Negative for fever.  Gastrointestinal: Negative for anorexia.  Skin: Negative for wound.  All other systems reviewed and are negative.    Physical Exam Updated Vital Signs BP (!) 141/98 (BP Location: Left Arm)   Pulse 62   Temp 98.4 F (36.9 C) (Oral)   Resp 20   Ht  (1.778  m)   Wt 280 lb (127 kg)   LMP 10/12/2016 (Exact Date)   SpO2 100%   BMI 40.18 kg/m   Physical Exam  Constitutional: She is oriented to person, place, and time. She appears well-developed and well-nourished.  HENT:  Head: Normocephalic and atraumatic.  Mouth/Throat: No oropharyngeal exudate.  Eyes: EOM are normal. Pupils are equal, round, and reactive to light.  Neck: Normal range of motion. Neck supple.  Cardiovascular: Normal rate, regular rhythm, normal heart sounds and intact distal pulses.   Pulmonary/Chest: Effort normal and breath sounds normal. She has no wheezes. She has no rales.  Abdominal: Soft. Bowel sounds are normal. She exhibits no mass. There is no tenderness. There is no rebound and no guarding.    Genitourinary:     Genitourinary Comments: Chaperones present   Musculoskeletal: Normal range of motion.  Neurological: She is alert and oriented to person, place, and time.  Skin: Skin is warm. Capillary refill takes less than 2 seconds. There is erythema.  Psychiatric: Her affect is angry. She is aggressive.     ED Treatments / Results  Labs (all labs ordered are listed, but only abnormal results are displayed) Labs Reviewed - No data to display  EKG  EKG Interpretation None       Radiology No results found.  Procedures .Marland KitchenIncision and Drainage Date/Time: 10/17/2016 5:08 AM Performed by: Cy Blamer Authorized by: Cy Blamer   Consent:    Consent obtained:  Verbal   Consent given by:  Patient   Risks discussed:  Bleeding, infection, incomplete drainage and pain   Alternatives discussed:  No treatment Location:    Type:  Abscess   Size:  1   Location:  Anogenital   Anogenital location:  Vulva Pre-procedure details:    Skin preparation:  Betadine Anesthesia (see MAR for exact dosages):    Anesthesia method:  Local infiltration   Local anesthetic:  Lidocaine 1% WITH epi Procedure type:    Complexity:  Simple Procedure details:    Needle aspiration: no     Incision types:  Single straight   Incision depth:  Submucosal   Scalpel blade:  11   Wound management:  Probed and deloculated   Drainage:  Purulent   Drainage amount:  Moderate   Wound treatment:  Wound left open   Packing materials:  None Post-procedure details:    Patient tolerance of procedure:  Tolerated well, no immediate complications   (including critical care time)  Medications Ordered in ED Medications  sulfamethoxazole-trimethoprim (BACTRIM DS,SEPTRA DS) 800-160 MG per tablet 1 tablet (1 tablet Oral Given 10/17/16 0402)  naproxen (NAPROSYN) tablet 500 mg (500 mg Oral Given 10/17/16 0351)  lidocaine-EPINEPHrine (XYLOCAINE W/EPI) 1 %-1:100000 (with pres) injection (1 mL  Given  10/17/16 0351)       Final Clinical Impressions(s) / ED Diagnoses   Final diagnoses:  Labial abscess  Cellulitis of skin  Exam, and vitals are benign and reassuring. The patient is nontoxic-appearing. Return for fevers, intractable pain, vomiting, worsening drainage, or any concerns. Take all antibiotics. Sitz bath instructions given.  RX for diflucan also given.   After history, exam, and medical workup I feel the patient has been appropriately medically screened and is safe for discharge home. Pertinent diagnoses were discussed with the patient. Patient was given return precautions.  New Prescriptions Discharge Medication List as of 10/17/2016  3:52 AM    START taking these medications   Details  naproxen (NAPROSYN) 375 MG tablet Take 1 tablet (  375 mg total) by mouth 2 (two) times daily., Starting Thu 10/17/2016, Print    sulfamethoxazole-trimethoprim (BACTRIM DS,SEPTRA DS) 800-160 MG tablet Take 1 tablet by mouth 2 (two) times daily., Starting Thu 10/17/2016, Until Thu 10/24/2016, Print         Aliscia Clayton, MD 10/17/16 [phone]

## 2016-10-17 NOTE — ED Triage Notes (Addendum)
Pt reports "boil" on the right labia.  Sts she had a bartholin's cyst 6 months ago on the left and now she has the same thing on the right.

## 2017-01-10 ENCOUNTER — Emergency Department (HOSPITAL_BASED_OUTPATIENT_CLINIC_OR_DEPARTMENT_OTHER)
Admission: EM | Admit: 2017-01-10 | Discharge: 2017-01-10 | Disposition: A | Discharge status: Home / Self Care | Payer: BLUE CROSS/BLUE SHIELD | Attending: Emergency Medicine | Admitting: Emergency Medicine

## 2017-01-10 ENCOUNTER — Encounter (HOSPITAL_BASED_OUTPATIENT_CLINIC_OR_DEPARTMENT_OTHER): Payer: Self-pay | Admitting: Emergency Medicine

## 2017-01-10 DIAGNOSIS — Z3202 Encounter for pregnancy test, result negative: Secondary | ICD-10-CM | POA: Insufficient documentation

## 2017-01-10 DIAGNOSIS — Z79899 Other long term (current) drug therapy: Secondary | ICD-10-CM | POA: Insufficient documentation

## 2017-01-10 LAB — URINALYSIS, ROUTINE W REFLEX MICROSCOPIC
Bilirubin Urine: NEGATIVE
Glucose, UA: NEGATIVE mg/dL
HGB URINE DIPSTICK: NEGATIVE
Ketones, ur: NEGATIVE mg/dL
Leukocytes, UA: NEGATIVE
Nitrite: NEGATIVE
PH: 6.5 (ref 5.0–8.0)
PROTEIN: NEGATIVE mg/dL
Specific Gravity, Urine: 1.017 (ref 1.005–1.030)

## 2017-01-10 LAB — PREGNANCY, URINE: Preg Test, Ur: NEGATIVE

## 2017-01-10 NOTE — ED Triage Notes (Signed)
Reports here for pregnancy test.  States took two at home-one negative and one positive.

## 2017-01-10 NOTE — Discharge Instructions (Signed)
Your pregnancy test was negative. You may follow-up with your doctor for reevaluation as needed. Return to ED for new or worsening symptoms as we discussed

## 2017-01-10 NOTE — ED Provider Notes (Signed)
[facility] Provider Note   CSN: [id number] Arrival date & time: 01/10/17  1418     History   Chief Complaint Chief Complaint  Patient presents with  . Possible Pregnancy    HPI Sandie AnoGerayli Mcnamara is a 31 y.o. female.  HPI here for evaluation of possible pregnancy. Patient reports she took a home pregnancy test that were inconclusive. She reports her last menstrual cycle was sometime in April or May. She reports unprotected intercourse with a female partner in May. She reports she is spotting now her she denies any fevers, chills, abdominal pain, urinary symptoms. She does report mild nausea and vomiting. Denies any other medical problems or complaints. Nothing makes the problem better or worse.  Past Medical History:  Diagnosis Date  . Bartholin cyst   . Kidney stones   . Obesity     There are no active problems to display for this patient.   Past Surgical History:  Procedure Laterality Date  . CESAREAN SECTION    . CHOLECYSTECTOMY    . KIDNEY STONE SURGERY      OB History    No data available       Home Medications    Prior to Admission medications   Medication Sig Start Date End Date Taking? Authorizing Provider  Aspirin-Acetaminophen-Caffeine (GOODY HEADACHE PO) Take 1 Package by mouth every 6 (six) hours as needed. pain     [provider]  clotrimazole (LOTRIMIN) 1 % cream Apply to affected area 2 times daily, not to be used internally. Only on external genitalia. 06/11/15   Marlon PelGreene, Tiffany, PA-C  Etonogestrel [facility](IMPLANON [place]) Inject into the skin once.      [provider]  HYDROcodone-acetaminophen (HYCET) 7.5-325 mg/15 ml solution Take 10 mLs by mouth 4 (four) times daily as needed for severe pain. 02/06/16   Trixie DredgeWest, Emily, PA-C  ibuprofen (ADVIL,MOTRIN) 800 MG tablet Take 1 tablet (800 mg total) by mouth every 8 (eight) hours as needed. 02/06/16   Trixie DredgeWest, Emily, PA-C  metroNIDAZOLE (FLAGYL) 500 MG tablet Take 1 tablet (500 mg total) by mouth 2  (two) times daily. 06/11/15   Marlon PelGreene, Tiffany, PA-C  naproxen (NAPROSYN) 375 MG tablet Take 1 tablet (375 mg total) by mouth 2 (two) times daily. 10/17/16   Palumbo, April, MD    Family History History reviewed. No pertinent family history.  Social History Social History  Substance Use Topics  . Smoking status: Never Smoker  . Smokeless tobacco: Never Used  . Alcohol use Yes     Comment: once a month     Allergies   Patient has no known allergies.   Review of Systems Review of Systems See history of present illness  Physical Exam Updated Vital Signs BP 132/78 (BP Location: Left Arm)   Pulse (!) 58   Temp 98.5 F (36.9 C) (Oral)   Resp 18   Ht 5\' 10"  (1.778 m)   Wt 123.8 kg (273 lb)   LMP 11/04/2016 (Approximate)   SpO2 100%   BMI 39.17 kg/m   Physical Exam  Constitutional: She appears well-developed. No distress.  Awake, alert and nontoxic in appearance  HENT:  Head: Normocephalic and atraumatic.  Right Ear: External ear normal.  Left Ear: External ear normal.  Mouth/Throat: Oropharynx is clear and moist.  Eyes: Conjunctivae and EOM are normal. Pupils are equal, round, and reactive to light.  Neck: Normal range of motion. No JVD present.  Cardiovascular: Normal rate, regular rhythm and normal heart sounds.  Pulmonary/Chest: Effort normal and breath sounds normal. No stridor.  Abdominal: Soft. There is no tenderness.  Musculoskeletal: Normal range of motion.  Neurological:  Awake, alert, cooperative and aware of situation; motor strength bilaterally; sensation normal to light touch bilaterally; no facial asymmetry; tongue midline; major cranial nerves appear intact;  baseline gait without new ataxia.  Skin: No rash noted. She is not diaphoretic.  Psychiatric: She has a normal mood and affect. Her behavior is normal. Thought content normal.  Nursing note and vitals reviewed.    ED Treatments / Results  Labs (all labs ordered are listed, but only abnormal  results are displayed) Labs Reviewed  URINALYSIS, ROUTINE W REFLEX MICROSCOPIC  PREGNANCY, URINE    EKG  EKG Interpretation None       Radiology No results found.  Procedures Procedures (including critical care time)  Medications Ordered in ED Medications - No data to display   Initial Impression / Assessment and Plan / ED Course  I have reviewed the triage vital signs and the nursing notes.  Pertinent labs & imaging results that were available during my care of the patient were reviewed by me and considered in my medical decision making (see chart for details).     Exam reassuring, pregnancy test is negative. No evidence of UTI on urinalysis. Follow-up with PCP. Return precautions discussed  Final Clinical Impressions(s) / ED Diagnoses   Final diagnoses:  Pregnancy examination or test, negative result    New Prescriptions New Prescriptions   No medications on file     Joycie Peek, Cordelia Poche 01/10/17 1533    Melene Plan, DO 01/11/17 [phone]

## 2017-06-25 ENCOUNTER — Other Ambulatory Visit: Payer: Self-pay

## 2017-06-25 ENCOUNTER — Emergency Department (HOSPITAL_BASED_OUTPATIENT_CLINIC_OR_DEPARTMENT_OTHER)
Admission: EM | Admit: 2017-06-25 | Discharge: 2017-06-25 | Disposition: A | Discharge status: Home / Self Care | Payer: Self-pay | Attending: Emergency Medicine | Admitting: Emergency Medicine

## 2017-06-25 ENCOUNTER — Emergency Department (HOSPITAL_BASED_OUTPATIENT_CLINIC_OR_DEPARTMENT_OTHER): Payer: Self-pay

## 2017-06-25 ENCOUNTER — Encounter (HOSPITAL_BASED_OUTPATIENT_CLINIC_OR_DEPARTMENT_OTHER): Payer: Self-pay

## 2017-06-25 DIAGNOSIS — M549 Dorsalgia, unspecified: Secondary | ICD-10-CM | POA: Insufficient documentation

## 2017-06-25 DIAGNOSIS — R0602 Shortness of breath: Secondary | ICD-10-CM | POA: Insufficient documentation

## 2017-06-25 DIAGNOSIS — R059 Cough, unspecified: Secondary | ICD-10-CM

## 2017-06-25 DIAGNOSIS — R062 Wheezing: Secondary | ICD-10-CM | POA: Insufficient documentation

## 2017-06-25 DIAGNOSIS — R05 Cough: Secondary | ICD-10-CM | POA: Insufficient documentation

## 2017-06-25 DIAGNOSIS — F17200 Nicotine dependence, unspecified, uncomplicated: Secondary | ICD-10-CM | POA: Insufficient documentation

## 2017-06-25 MED ORDER — ALBUTEROL SULFATE HFA 108 (90 BASE) MCG/ACT IN AERS
2.0000 | INHALATION_SPRAY | RESPIRATORY_TRACT | Status: DC
Start: 2017-06-26 — End: 2017-06-26
  Administered 2017-06-25: 2 via RESPIRATORY_TRACT
  Filled 2017-06-25: qty 6.7

## 2017-06-25 MED ORDER — IPRATROPIUM-ALBUTEROL 0.5-2.5 (3) MG/3ML IN SOLN
3.0000 mL | Freq: Once | RESPIRATORY_TRACT | Status: AC
  Administered 2017-06-25: 3 mL via RESPIRATORY_TRACT
  Filled 2017-06-25: qty 3

## 2017-06-25 MED ORDER — ALBUTEROL SULFATE (2.5 MG/3ML) 0.083% IN NEBU
2.5000 mg | INHALATION_SOLUTION | Freq: Once | RESPIRATORY_TRACT | Status: AC
  Administered 2017-06-25: 2.5 mg via RESPIRATORY_TRACT
  Filled 2017-06-25: qty 3

## 2017-06-25 MED ORDER — PREDNISONE 20 MG PO TABS: 40.0000 mg | ORAL_TABLET | Freq: Every day | ORAL | 0 refills | Status: DC

## 2017-06-25 MED ORDER — PREDNISONE 20 MG PO TABS
40.0000 mg | ORAL_TABLET | Freq: Once | ORAL | Status: AC
  Administered 2017-06-25: 40 mg via ORAL
  Filled 2017-06-25: qty 2

## 2017-06-25 NOTE — ED Provider Notes (Signed)
[facility] EMERGENCY DEPARTMENT Provider Note   CSN: [id number] Arrival date & time: 06/25/17  2140     History   Chief Complaint Chief Complaint  Patient presents with  . Cough    HPI Sonia Key is a 31 y.o. female.  Patient presents with one-week history of shortness of breath and cough without fever.  Patient has pain with deep breathing mainly in her left middle back.  No hemoptysis.  She states she had similar symptoms before when she was diagnosed with pneumonia.  No ear pain, runny nose, sore throat.  No abdominal pain, nausea, vomiting, diarrhea or diarrhea.  No urinary symptoms. Patient denies risk factors for pulmonary embolism including: unilateral leg swelling, history of DVT/PE/other blood clots, use of exogenous hormones, recent immobilizations, recent surgery, recent travel (>4hr segment), malignancy, hemoptysis. The onset of this condition was acute. The course is constant. Aggravating factors: movement and palpation for back pain. Alleviating factors: none. No treatments PTA.         Past Medical History:  Diagnosis Date  . Bartholin cyst   . Kidney stones   . Obesity     There are no active problems to display for this patient.   Past Surgical History:  Procedure Laterality Date  . CESAREAN SECTION    . CHOLECYSTECTOMY    . KIDNEY STONE SURGERY      OB History    No data available       Home Medications    Prior to Admission medications   Not on File    Family History No family history on file.  Social History Social History   Tobacco Use  . Smoking status: Current Some Day Smoker  . Smokeless tobacco: Never Used  Substance Use Topics  . Alcohol use: Yes    Comment: occ  . Drug use: No     Allergies   Patient has no allergy information on record.   Review of Systems Review of Systems  Constitutional: Negative for fever.  HENT: Negative for congestion, rhinorrhea and sore throat.   Eyes: Negative for  redness.  Respiratory: Positive for cough, shortness of breath and wheezing.   Cardiovascular: Negative for chest pain and leg swelling.  Gastrointestinal: Negative for abdominal pain, diarrhea, nausea and vomiting.  Genitourinary: Negative for dysuria.  Musculoskeletal: Positive for back pain. Negative for myalgias.  Skin: Negative for rash.  Neurological: Negative for headaches.     Physical Exam Updated Vital Signs BP (!) 145/101 (BP Location: Left Arm)   Pulse 80   Temp 98.1 F (36.7 C) (Oral)   Resp 20   Ht 5\' 10"  (1.778 m)   Wt 135.2 kg (298 lb)   LMP 06/21/2017   SpO2 98%   BMI 42.76 kg/m   Physical Exam  Constitutional: She appears well-developed and well-nourished.  HENT:  Head: Normocephalic and atraumatic.  Mouth/Throat: Oropharynx is clear and moist.  Eyes: Conjunctivae are normal. Right eye exhibits no discharge. Left eye exhibits no discharge.  Neck: Normal range of motion. Neck supple.  Cardiovascular: Normal rate, regular rhythm and normal heart sounds.  No murmur heard. Pulmonary/Chest: Effort normal. No respiratory distress. She has wheezes (Mild to moderate expiratory wheezing). She has no rales.  Abdominal: Soft. There is no tenderness. There is no rebound and no guarding.  Musculoskeletal: She exhibits tenderness.       Thoracic back: She exhibits tenderness. She exhibits normal range of motion and no bony tenderness.  Back:  Neurological: She is alert.  Skin: Skin is warm and dry.  Psychiatric: She has a normal mood and affect.  Nursing note and vitals reviewed.    ED Treatments / Results   Radiology Dg Chest 2 View  Result Date: 06/25/2017 CLINICAL DATA:  Cough EXAM: CHEST  2 VIEW COMPARISON:  None. FINDINGS: The heart size and mediastinal contours are within normal limits. Both lungs are clear. The visualized skeletal structures are unremarkable. IMPRESSION: No active cardiopulmonary disease. Electronically Signed   By: Deatra RobinsonKevin  Herman  M.D.   On: 06/25/2017 22:31    Procedures Procedures (including critical care time)  Medications Ordered in ED Medications  ipratropium-albuterol (DUONEB) 0.5-2.5 (3) MG/3ML nebulizer solution 3 mL (not administered)  albuterol (PROVENTIL) (2.5 MG/3ML) 0.083% nebulizer solution 2.5 mg (not administered)     Initial Impression / Assessment and Plan / ED Course  I have reviewed the triage vital signs and the nursing notes.  Pertinent labs & imaging results that were available during my care of the patient were reviewed by me and considered in my medical decision making (see chart for details).     Patient seen and examined. Work-up initiated.  Will give breathing treatment.  X-ray reviewed by myself.  Vital signs reviewed and are as follows: BP (!) 145/101 (BP Location: Left Arm)   Pulse 80   Temp 98.1 F (36.7 C) (Oral)   Resp 20   Ht 5\' 10"  (1.778 m)   Wt 135.2 kg (298 lb)   LMP 06/21/2017   SpO2 98%   BMI 42.76 kg/m   11:06 PM wheezing is nearly resolved after breathing treatment.  Patient reports market improvement in her breathing and is no longer short of breath.  She appears much more comfortable and breathing easily.  Will discharge to home with albuterol inhaler and prednisone burst.  Encouraged patient to follow-up with her doctor in the next several days if not improved.  Encouraged return to the emergency department with worsening pain or shortness of breath, fever, other symptoms or other concerns.  She verbalizes understanding and agrees with the plan.  Final Clinical Impressions(s) / ED Diagnoses   Final diagnoses:  Cough  Wheezing   Patient with several days of cough and wheezing noted on exam.  Normal vital signs.  Chest x-ray without pneumonia.  No fevers.  Patient has some back pain however this is readily reproducible to palpation and with movement.  She is PERC negative.  Low concern for PE.  Especially since her symptoms much improved with albuterol,  suspect that bronchospasm is what is causing her shortness of breath.  ED Discharge Orders        Ordered    predniSONE (DELTASONE) 20 MG tablet  Daily     06/25/17 2304       Renne CriglerGeiple, Graviela Nodal, PA-C 06/25/17 2308    Little, Ambrose Finlandachel Morgan, MD 06/26/17 1728

## 2017-06-25 NOTE — ED Triage Notes (Signed)
C/o flu like sx x 1 week-NAD-steady gait 

## 2017-06-25 NOTE — Discharge Instructions (Signed)
Please read and follow all provided instructions.  Your diagnoses today include:  1. Cough   2. Wheezing     Tests performed today include:  Chest x-ray - no pneumonia today  Vital signs. See below for your results today.   Medications prescribed:   Albuterol inhaler - medication that opens up your airway  Use inhaler as follows: 1-2 puffs with spacer every 4 hours as needed for wheezing, cough, or shortness of breath.    Prednisone - steroid medicine   It is best to take this medication in the morning to prevent sleeping problems. If you are diabetic, monitor your blood sugar closely and stop taking Prednisone if blood sugar is over 300. Take with food to prevent stomach upset.  Take any prescribed medications only as directed.  Home care instructions:  Follow any educational materials contained in this packet.  Follow-up instructions: Please follow-up with your primary care provider in the next 3 days for further evaluation of your symptoms and management of your asthma.  Return instructions:   Please return to the Emergency Department if you experience worsening symptoms.  Please return with worsening wheezing, shortness of breath, or difficulty breathing.  Return with persistent fever above 101F.   Please return if you have any other emergent concerns.  Additional Information:  Your vital signs today were: BP (!) 145/101 (BP Location: Left Arm)    Pulse 80    Temp 98.1 F (36.7 C) (Oral)    Resp 20    Ht 5\' 10"  (1.778 m)    Wt 135.2 kg (298 lb)    LMP 06/21/2017    SpO2 98%    BMI 42.76 kg/m  If your blood pressure (BP) was elevated above 135/85 this visit, please have this repeated by your doctor within one month. --------------

## 2017-06-25 NOTE — ED Notes (Signed)
C/o cough and diff catching breath x 1 week w left side and upper back pain  States started new job 2 months where there is a lot of smoke

## 2017-08-26 ENCOUNTER — Other Ambulatory Visit: Payer: Self-pay

## 2017-08-26 ENCOUNTER — Encounter (HOSPITAL_BASED_OUTPATIENT_CLINIC_OR_DEPARTMENT_OTHER): Payer: Self-pay | Admitting: *Deleted

## 2017-08-26 ENCOUNTER — Emergency Department (HOSPITAL_BASED_OUTPATIENT_CLINIC_OR_DEPARTMENT_OTHER)
Admission: EM | Admit: 2017-08-26 | Discharge: 2017-08-26 | Disposition: A | Discharge status: Home / Self Care | Payer: Self-pay | Attending: Emergency Medicine | Admitting: Emergency Medicine

## 2017-08-26 ENCOUNTER — Emergency Department (HOSPITAL_BASED_OUTPATIENT_CLINIC_OR_DEPARTMENT_OTHER): Payer: Self-pay

## 2017-08-26 DIAGNOSIS — F17200 Nicotine dependence, unspecified, uncomplicated: Secondary | ICD-10-CM | POA: Insufficient documentation

## 2017-08-26 DIAGNOSIS — R05 Cough: Secondary | ICD-10-CM | POA: Insufficient documentation

## 2017-08-26 DIAGNOSIS — R062 Wheezing: Secondary | ICD-10-CM | POA: Insufficient documentation

## 2017-08-26 DIAGNOSIS — R059 Cough, unspecified: Secondary | ICD-10-CM

## 2017-08-26 MED ORDER — IPRATROPIUM-ALBUTEROL 0.5-2.5 (3) MG/3ML IN SOLN
3.0000 mL | Freq: Once | RESPIRATORY_TRACT | Status: AC
Start: 2017-08-26 — End: 2017-08-26
  Administered 2017-08-26: 3 mL via RESPIRATORY_TRACT
  Filled 2017-08-26: qty 3

## 2017-08-26 MED ORDER — PREDNISONE 10 MG PO TABS
60.0000 mg | ORAL_TABLET | Freq: Once | ORAL | Status: AC
  Administered 2017-08-26: 60 mg via ORAL
  Filled 2017-08-26: qty 1

## 2017-08-26 MED ORDER — PREDNISONE 20 MG PO TABS: 60.0000 mg | ORAL_TABLET | Freq: Every day | ORAL | 0 refills | Status: AC

## 2017-08-26 MED ORDER — ALBUTEROL SULFATE HFA 108 (90 BASE) MCG/ACT IN AERS
1.0000 | INHALATION_SPRAY | RESPIRATORY_TRACT | Status: DC | PRN
  Administered 2017-08-26: 2 via RESPIRATORY_TRACT
  Filled 2017-08-26: qty 6.7

## 2017-08-26 NOTE — Discharge Instructions (Addendum)
Please use your inhaler.  You may take 1-2 puffs every 4-6 hours as needed for shortness of breath and wheezing.    I have given you a prescription for steroids today.  Some common side effects include feelings of extra energy, feeling warm, increased appetite, and stomach upset.  If you are diabetic your sugars may run higher than usual.

## 2017-08-26 NOTE — ED Triage Notes (Signed)
Pt c/o URI symptoms 1 month with pro cough

## 2017-08-26 NOTE — ED Provider Notes (Signed)
[facility] EMERGENCY DEPARTMENT Provider Note   CSN: [id number] Arrival date & time: 08/26/17  1753     History   Chief Complaint Chief Complaint  Patient presents with  . URI     Sonia AnoGerayli Suchy is a 32 y.o. female who presents today for evaluation of cough since December.  She has been seen here for this in December.  She reports that in December she was given an albuterol and wheezing with mild relief.  She reports concerned that her cough is not getting better and has lingered since December.  No fevers or chills.  She [place] to be seen [place] evaluation but left before being seen.  She does not have a PCP.  She denies nasal congestion.  No CP.    HPI  Past Medical History:  Diagnosis Date  . Bartholin cyst   . Kidney stones   . Obesity     There are no active problems to display for this patient.   Past Surgical History:  Procedure Laterality Date  . CESAREAN SECTION    . CHOLECYSTECTOMY    . KIDNEY STONE SURGERY      OB History    No data available       Home Medications    Prior to Admission medications   Medication Sig Start Date End Date Taking? Authorizing Provider  predniSONE (DELTASONE) 20 MG tablet Take 3 tablets (60 mg total) by mouth daily for 4 days. 08/26/17 08/30/17  Cristina GongHammond, Wylee Dorantes W, PA-C    Family History History reviewed. No pertinent family history.  Social History Social History   Tobacco Use  . Smoking status: Current Some Day Smoker  . Smokeless tobacco: Never Used  Substance Use Topics  . Alcohol use: Yes    Comment: occ  . Drug use: No     Allergies   Ciprofloxacin   Review of Systems Review of Systems  Constitutional: Negative for chills and fever.  HENT: Negative for congestion and sore throat.   Respiratory: Positive for cough and shortness of breath (Occasional, not now. ).   Cardiovascular: Negative for chest pain.     Physical Exam Updated Vital Signs BP (!) 140/91    Pulse 76   Temp 98.7 F (37.1 C)   Resp 18   Ht 5\' 11"  (1.803 m)   Wt 136.1 kg (300 lb)   LMP 07/23/2017   SpO2 100%   BMI 41.84 kg/m   Physical Exam  Constitutional: She is oriented to person, place, and time. She appears well-developed and well-nourished.  HENT:  Head: Normocephalic and atraumatic.  Mouth/Throat: Oropharynx is clear and moist.  Eyes: Conjunctivae are normal.  Neck: Normal range of motion. Neck supple.  Cardiovascular: Normal rate, regular rhythm and normal heart sounds.  Pulmonary/Chest: Effort normal. She has wheezes (Mild left sided. ).  Lymphadenopathy:    She has no cervical adenopathy.  Neurological: She is alert and oriented to person, place, and time.  Skin: She is not diaphoretic.  Nursing note and vitals reviewed.    ED Treatments / Results  Labs (all labs ordered are listed, but only abnormal results are displayed) Labs Reviewed - No data to display  EKG  EKG Interpretation None       Radiology Dg Chest 2 View  Result Date: 08/26/2017 CLINICAL DATA:  Productive cough for 2 months. EXAM: CHEST  2 VIEW COMPARISON:  06/25/2017 FINDINGS: Cardiomediastinal silhouette is normal. Mediastinal contours appear intact. There is no evidence  of focal airspace consolidation, pleural effusion or pneumothorax. Osseous structures are without acute abnormality. Soft tissues are grossly normal. IMPRESSION: No active cardiopulmonary disease. Electronically Signed   By: Ted Mcalpine M.D.   On: 08/26/2017 19:15    Procedures Procedures (including critical care time)  Medications Ordered in ED Medications  albuterol (PROVENTIL HFA;VENTOLIN HFA) 108 (90 Base) MCG/ACT inhaler 1-2 puff (2 puffs Inhalation Given 08/26/17 2024)  ipratropium-albuterol (DUONEB) 0.5-2.5 (3) MG/3ML nebulizer solution 3 mL (3 mLs Nebulization Given 08/26/17 1948)  predniSONE (DELTASONE) tablet 60 mg (60 mg Oral Given 08/26/17 1935)     Initial Impression / Assessment and Plan  / ED Course  I have reviewed the triage vital signs and the nursing notes.  Pertinent labs & imaging results that were available during my care of the patient were reviewed by me and considered in my medical decision making (see chart for details).  Clinical Course as of Aug 26 2025  Tue Aug 26, 2017  1921 Attempted to see patient, with registration.   [EH]    Clinical Course User Index [EH] Cristina Gong, PA-C   Sonia Key presents today for evaluation of 2 months of cough.  Exam initially revealed mild left-sided wheezing.  She was given a DuoNeb treatment after which this fully resolved.  She was given a dose of prednisone while in the emergency room and a prescription for 4 additional days to complete a prednisone burst.  Chest x-ray obtained without acute abnormalities.  She was advised to start taking a over-the-counter antihistamine to see if that would help with her cough.  She was given follow-up with a wellness clinic.     At this time there does not appear to be any evidence of an acute emergency medical condition and the patient appears stable for discharge with appropriate outpatient follow up.Diagnosis was discussed with patient who verbalizes understanding and is agreeable to discharge.    Final Clinical Impressions(s) / ED Diagnoses   Final diagnoses:  Cough    ED Discharge Orders        Ordered    predniSONE (DELTASONE) 20 MG tablet  Daily     08/26/17 2022       Cristina Gong, [place] 08/26/17 2027    Pricilla Loveless, MD 08/27/17 [phone]

## 2017-08-26 NOTE — ED Notes (Signed)
Cough for over two months, has been seen a few times for same.  Pt states she has tried tussen without relief as well as an inhaler.

## 2017-08-26 NOTE — ED Notes (Signed)
Pt brought back to hallway bed, refusing to sit on bed

## 2019-03-06 IMAGING — CR DG CHEST 2V
2 series · 2 of 2 positions shown · non-contrast
Comparison: 06/25/2017

CLINICAL DATA: Productive cough for 2 months.

EXAM:
CHEST  2 VIEW

[w chest pa]
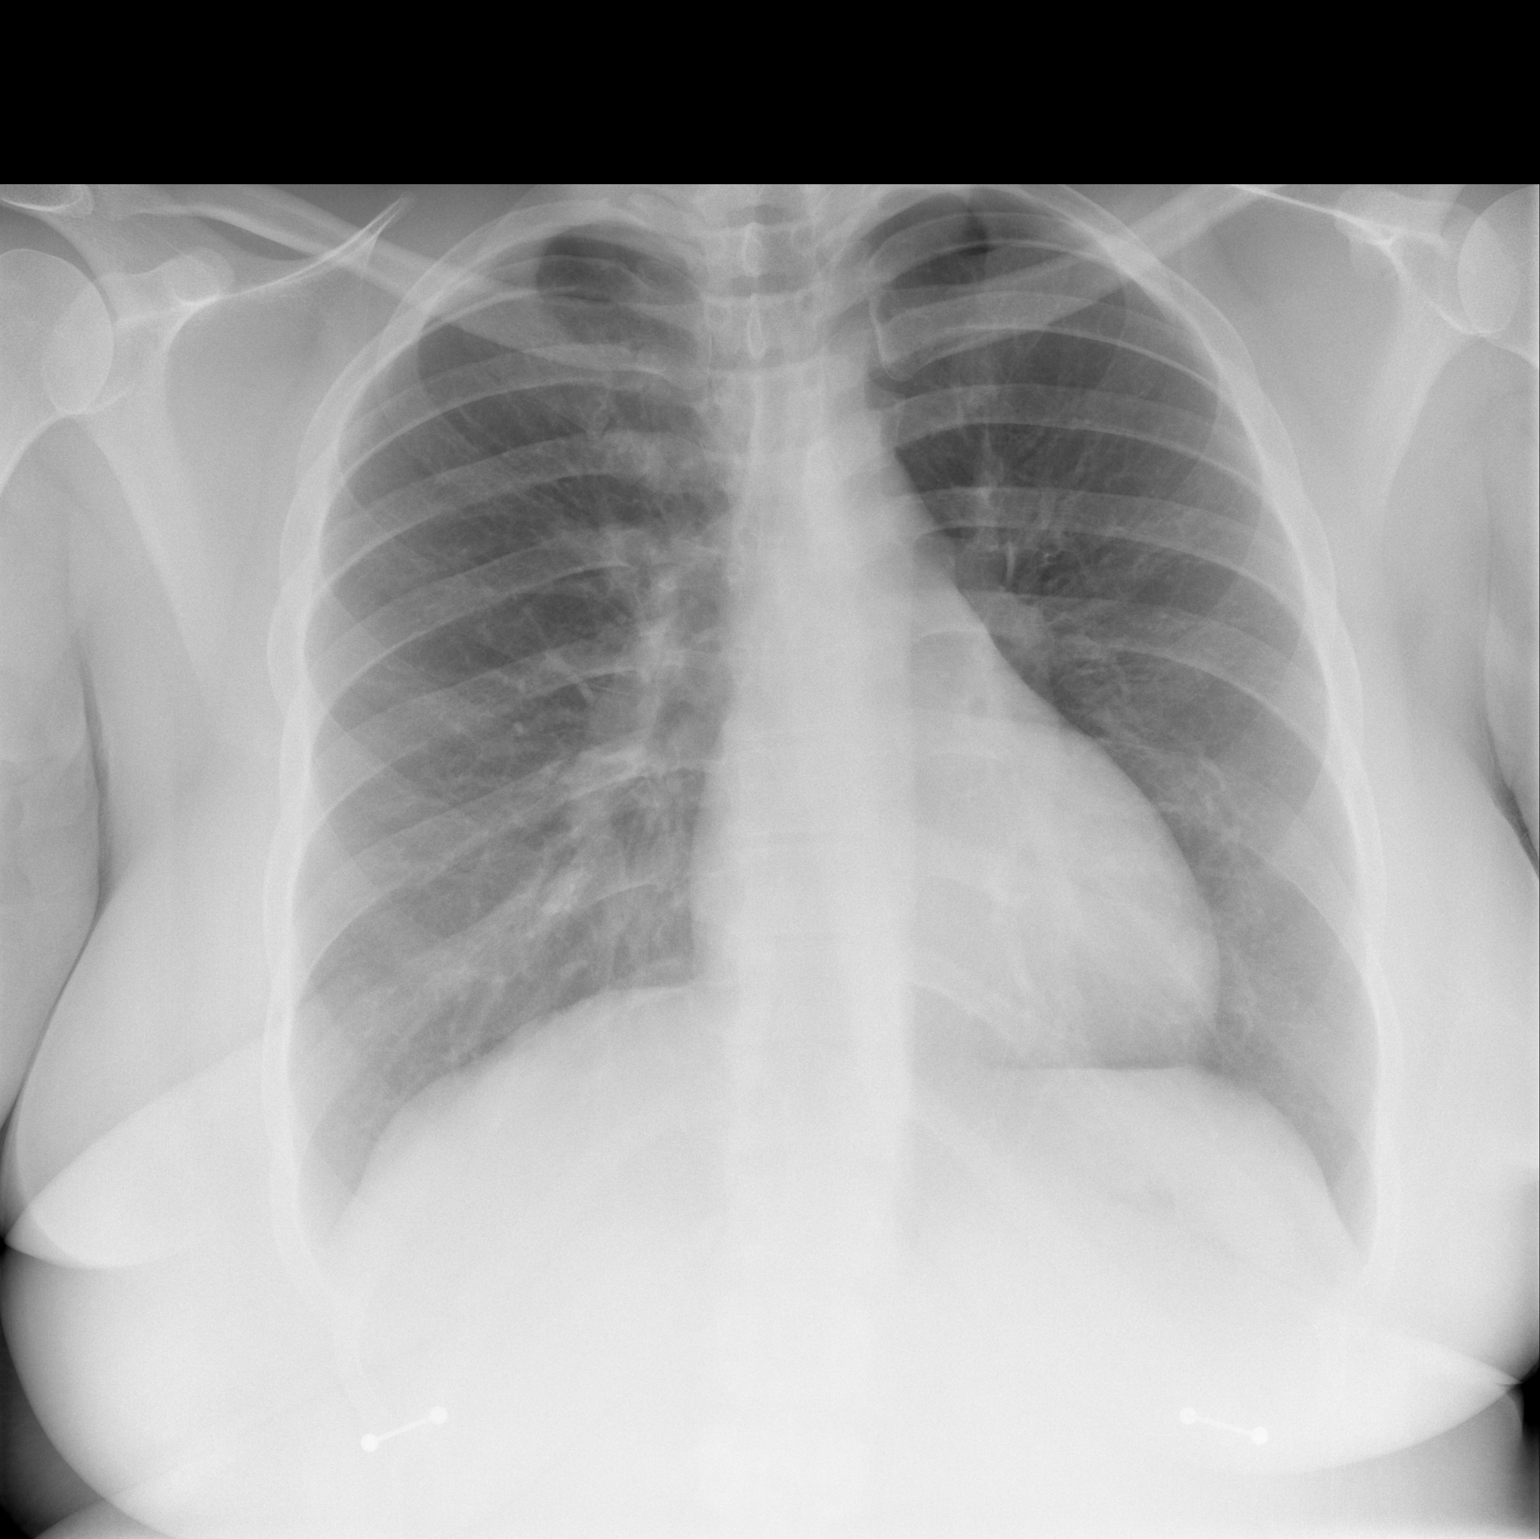

[w chest lat]
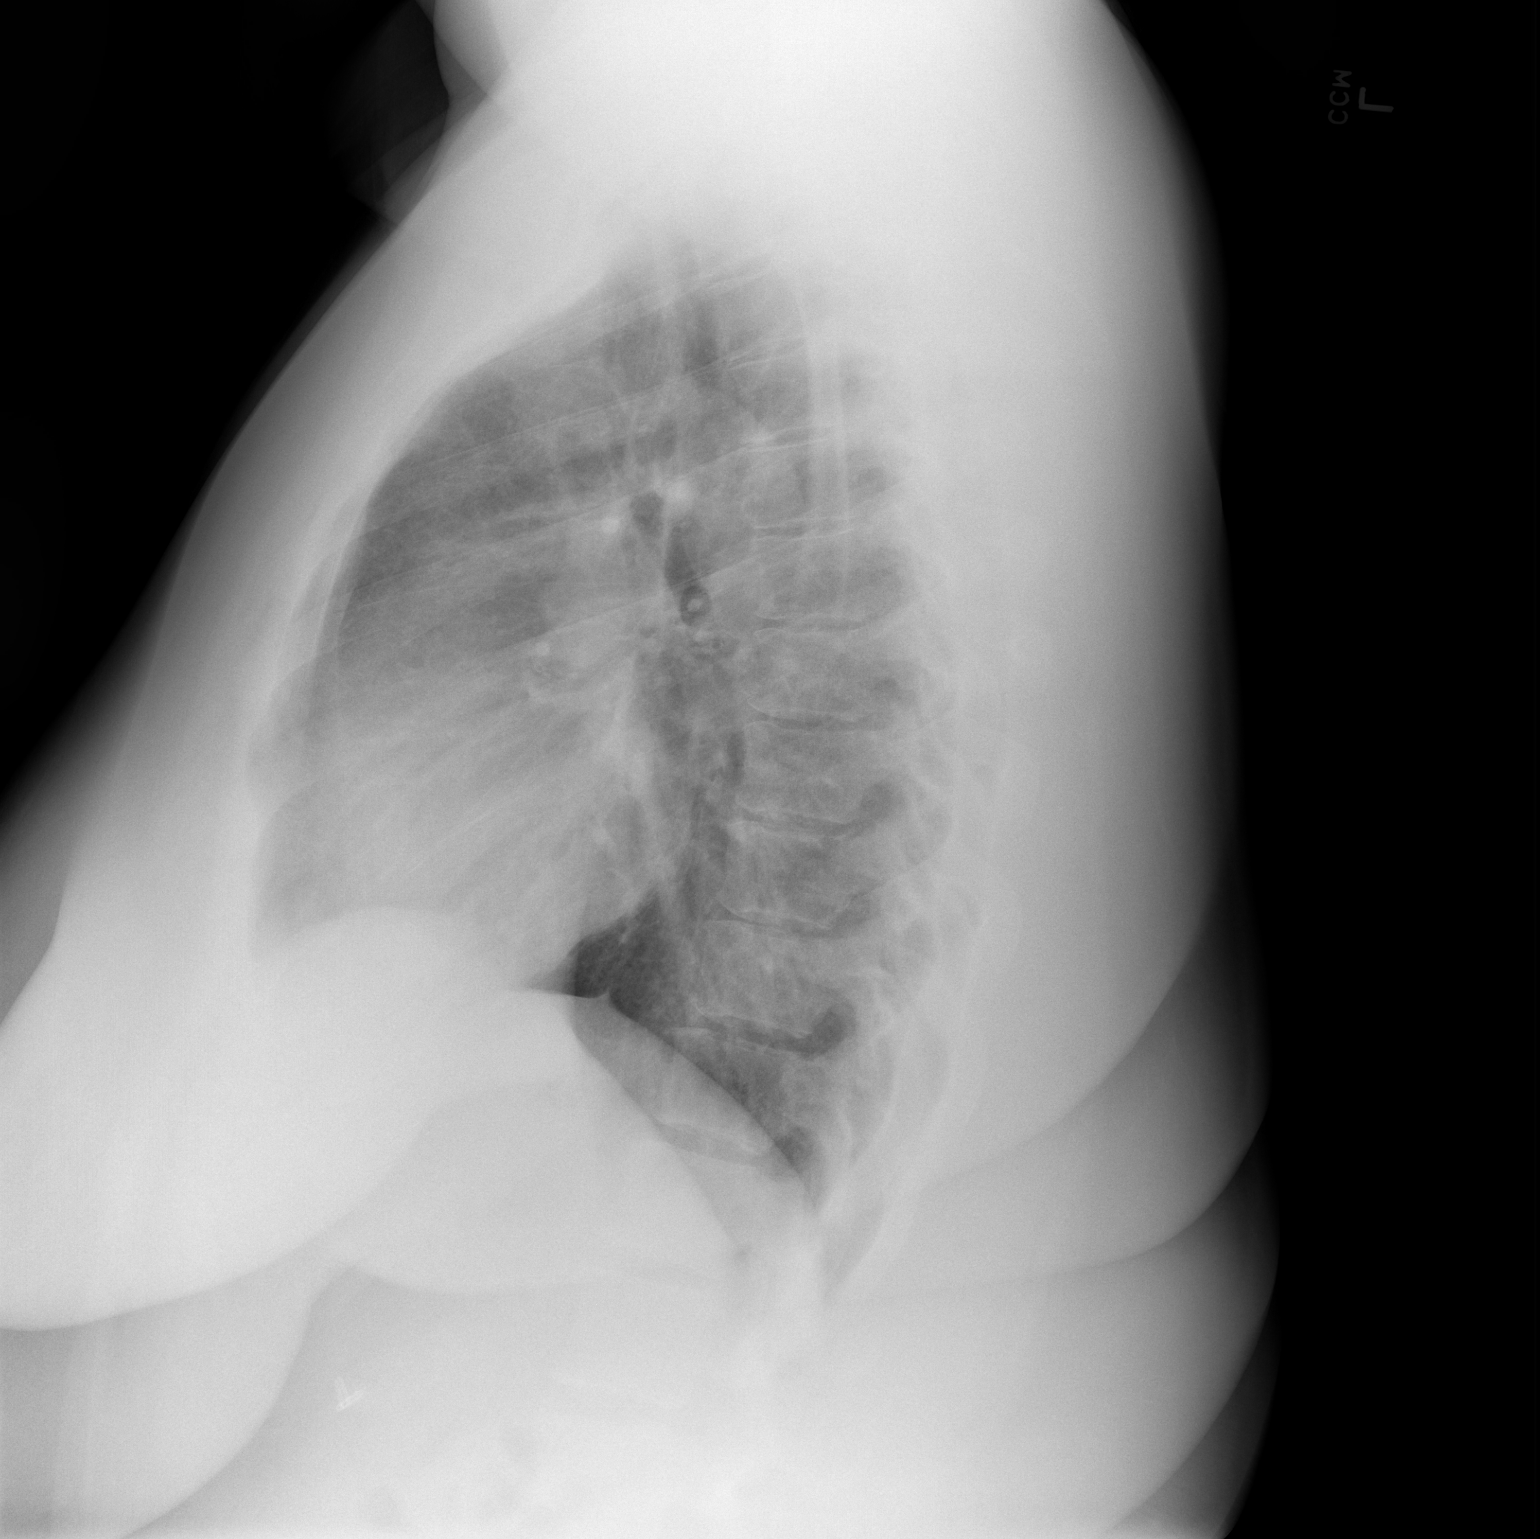

[2 of 2 positions shown; findings below may reference images not displayed]

FINDINGS: Cardiomediastinal silhouette is normal. Mediastinal contours appear
intact.

There is no evidence of focal airspace consolidation, pleural
effusion or pneumothorax.

Osseous structures are without acute abnormality. Soft tissues are
grossly normal.
IMPRESSION: No active cardiopulmonary disease.

## 2019-05-04 ENCOUNTER — Emergency Department (HOSPITAL_COMMUNITY)
Admission: EM | Admit: 2019-05-04 | Discharge: 2019-05-04 | Disposition: A | Discharge status: Home / Self Care | Payer: BC Managed Care – PPO | Attending: Emergency Medicine | Admitting: Emergency Medicine

## 2019-05-04 ENCOUNTER — Other Ambulatory Visit: Payer: Self-pay

## 2019-05-04 ENCOUNTER — Encounter (HOSPITAL_COMMUNITY): Payer: Self-pay | Admitting: Emergency Medicine

## 2019-05-04 DIAGNOSIS — Z5321 Procedure and treatment not carried out due to patient leaving prior to being seen by health care provider: Secondary | ICD-10-CM | POA: Insufficient documentation

## 2019-05-04 DIAGNOSIS — O219 Vomiting of pregnancy, unspecified: Secondary | ICD-10-CM | POA: Diagnosis not present

## 2019-05-04 DIAGNOSIS — R1084 Generalized abdominal pain: Secondary | ICD-10-CM | POA: Insufficient documentation

## 2019-05-04 DIAGNOSIS — Z3A00 Weeks of gestation of pregnancy not specified: Secondary | ICD-10-CM | POA: Insufficient documentation

## 2019-05-04 DIAGNOSIS — O99891 Other specified diseases and conditions complicating pregnancy: Secondary | ICD-10-CM | POA: Diagnosis present

## 2019-05-04 HISTORY — DX: Unspecified asthma, uncomplicated: J45.909

## 2019-05-04 NOTE — ED Triage Notes (Signed)
Pt reports around 840am today started feeling dizzy and having abd cramping. Reports n/v since found out was pregnant. Reports was taking Mucinex for sinus/URI symptoms.

## 2021-03-01 ENCOUNTER — Other Ambulatory Visit: Payer: Self-pay

## 2021-03-01 ENCOUNTER — Emergency Department (HOSPITAL_BASED_OUTPATIENT_CLINIC_OR_DEPARTMENT_OTHER)
Admission: EM | Admit: 2021-03-01 | Discharge: 2021-03-01 | Disposition: A | Discharge status: Home / Self Care | Payer: Medicaid Other | Attending: Emergency Medicine | Admitting: Emergency Medicine

## 2021-03-01 ENCOUNTER — Encounter (HOSPITAL_BASED_OUTPATIENT_CLINIC_OR_DEPARTMENT_OTHER): Payer: Self-pay

## 2021-03-01 DIAGNOSIS — Z20822 Contact with and (suspected) exposure to covid-19: Secondary | ICD-10-CM | POA: Diagnosis not present

## 2021-03-01 DIAGNOSIS — Z87891 Personal history of nicotine dependence: Secondary | ICD-10-CM | POA: Insufficient documentation

## 2021-03-01 DIAGNOSIS — J039 Acute tonsillitis, unspecified: Secondary | ICD-10-CM | POA: Insufficient documentation

## 2021-03-01 DIAGNOSIS — J45909 Unspecified asthma, uncomplicated: Secondary | ICD-10-CM | POA: Diagnosis not present

## 2021-03-01 DIAGNOSIS — R070 Pain in throat: Secondary | ICD-10-CM | POA: Diagnosis present

## 2021-03-01 LAB — RESP PANEL BY RT-PCR (FLU A&B, COVID) ARPGX2
Influenza A by PCR: NEGATIVE
Influenza B by PCR: NEGATIVE
SARS Coronavirus 2 by RT PCR: NEGATIVE

## 2021-03-01 LAB — GROUP A STREP BY PCR: Group A Strep by PCR: NOT DETECTED

## 2021-03-01 MED ORDER — DEXAMETHASONE 6 MG PO TABS
10.0000 mg | ORAL_TABLET | Freq: Once | ORAL | Status: AC
  Administered 2021-03-01: 10 mg via ORAL
  Filled 2021-03-01: qty 1

## 2021-03-01 NOTE — ED Notes (Signed)
Explained to patient 1 hour wait time for strep test. Pt agrees. Given ice pop for sore throat.

## 2021-03-01 NOTE — ED Notes (Signed)
Pt denies needing anything for pain at this time

## 2021-03-01 NOTE — ED Provider Notes (Signed)
[facility] EMERGENCY DEPARTMENT Provider Note   CSN: [id number] Arrival date & time: 03/01/21  1129     History Chief Complaint  Patient presents with   Sore Throat    Sonia Key is a 35 y.o. female.  35 year old female with history of asthma presents with complaint of sore throat x3 days.  States that she is hoarse and noticed white patches on her left tonsil area which she treated at home with honey and tea.  Symptoms seem to improve but now has white coating on her right tonsil.  No known sick contacts.  No other complaints or concerns.      Past Medical History:  Diagnosis Date   Asthma    Bartholin cyst    Kidney stones    Obesity     There are no problems to display for this patient.   Past Surgical History:  Procedure Laterality Date   CESAREAN SECTION     CHOLECYSTECTOMY     KIDNEY STONE SURGERY       OB History     Gravida  1   Para      Term      Preterm      AB      Living         SAB      IAB      Ectopic      Multiple      Live Births              No family history on file.  Social History   Tobacco Use   Smoking status: Former   Smokeless tobacco: Never  Building services engineer Use: Never used  Substance Use Topics   Alcohol use: Yes    Comment: occ   Drug use: No    Home Medications Prior to Admission medications   Not on File    Allergies    Ciprofloxacin  Review of Systems   Review of Systems  Constitutional:  Negative for chills and fever.  HENT:  Positive for sore throat and voice change. Negative for congestion, ear pain and trouble swallowing.   Respiratory:  Negative for cough.   Musculoskeletal:  Negative for arthralgias and myalgias.  Skin:  Negative for rash and wound.  Allergic/Immunologic: Negative for immunocompromised state.  Neurological:  Negative for headaches.  Hematological:  Negative for adenopathy.  Psychiatric/Behavioral:  Negative for confusion.   All other systems  reviewed and are negative.  Physical Exam Updated Vital Signs BP (!) 147/106 (BP Location: Left Wrist)   Pulse 66   Temp 98.3 F (36.8 C) (Oral)   Resp 15   Ht 5\' 10"  (1.778 m)   Wt (!) 173 kg   LMP 02/02/2021   SpO2 99%   BMI 54.72 kg/m   Physical Exam Vitals and nursing note reviewed.  Constitutional:      General: She is not in acute distress.    Appearance: She is well-developed. She is not diaphoretic.  HENT:     Head: Normocephalic and atraumatic.     Right Ear: Tympanic membrane and ear canal normal.     Left Ear: Tympanic membrane and ear canal normal.     Nose: No congestion.     Mouth/Throat:     Mouth: Mucous membranes are moist.     Pharynx: No posterior oropharyngeal erythema or uvula swelling.     Tonsils: Tonsillar exudate present. 2+ on the right. 1+ on the left.  Cardiovascular:     Rate and Rhythm: Normal rate and regular rhythm.     Heart sounds: Normal heart sounds.  Pulmonary:     Effort: Pulmonary effort is normal.  Musculoskeletal:     Cervical back: Neck supple.  Lymphadenopathy:     Cervical: No cervical adenopathy.  Neurological:     Mental Status: She is alert and oriented to person, place, and time.  Psychiatric:        Behavior: Behavior normal.    ED Results / Procedures / Treatments   Labs (all labs ordered are listed, but only abnormal results are displayed) Labs Reviewed  GROUP A STREP BY PCR  RESP PANEL BY RT-PCR (FLU A&B, COVID) ARPGX2    EKG None  Radiology No results found.  Procedures Procedures   Medications Ordered in ED Medications  dexamethasone (DECADRON) tablet 10 mg (has no administration in time range)    ED Course  I have reviewed the triage vital signs and the nursing notes.  Pertinent labs & imaging results that were available during my care of the patient were reviewed by me and considered in my medical decision making (see chart for details).  Clinical Course as of 03/01/21 1314  Thu Mar 01, 7525  3148 35 year old female with complaint of sore throat x3 days.  Found to have white exudate on bilateral tonsils without large cervical lymphadenopathy. COVID and flu as well as strep test negative. Discussed likely viral etiology, possibly mono.  Recommend monotest in 1 week if not improving.  Given Decadron in the ER for her symptoms.  Home to rest, hydrate, recheck with PCP as needed. [LM]    Clinical Course User Index [LM] Alden Hipp   MDM Rules/Calculators/A&P                           Final Clinical Impression(s) / ED Diagnoses Final diagnoses:  Tonsillitis    Rx / DC Orders ED Discharge Orders     None        Alden Hipp 03/01/21 1315    Terald Sleeper, MD 03/01/21 1736

## 2021-03-01 NOTE — ED Triage Notes (Signed)
Pt c/o sore throat, HA x 3 days-NAD-steady gait

## 2021-03-01 NOTE — Discharge Instructions (Signed)
Your strep, COVID, flu tests are all negative today.  Your symptoms may be related to a viral illness.  If not improving, consider testing for mono in 1 week.  He got your primary care provider's office.  In the meantime, recommend Motrin and Tylenol as needed as directed.  Plenty of hydrating fluids.  Continue with tea and honey.

## 2021-09-10 ENCOUNTER — Emergency Department (HOSPITAL_BASED_OUTPATIENT_CLINIC_OR_DEPARTMENT_OTHER)
Admission: EM | Admit: 2021-09-10 | Discharge: 2021-09-10 | Disposition: A | Discharge status: Home / Self Care | Payer: Medicaid Other | Attending: Emergency Medicine | Admitting: Emergency Medicine

## 2021-09-10 ENCOUNTER — Encounter (HOSPITAL_BASED_OUTPATIENT_CLINIC_OR_DEPARTMENT_OTHER): Payer: Self-pay | Admitting: *Deleted

## 2021-09-10 ENCOUNTER — Emergency Department (HOSPITAL_BASED_OUTPATIENT_CLINIC_OR_DEPARTMENT_OTHER): Payer: Medicaid Other

## 2021-09-10 ENCOUNTER — Other Ambulatory Visit: Payer: Self-pay

## 2021-09-10 DIAGNOSIS — Z20822 Contact with and (suspected) exposure to covid-19: Secondary | ICD-10-CM | POA: Diagnosis not present

## 2021-09-10 DIAGNOSIS — J45909 Unspecified asthma, uncomplicated: Secondary | ICD-10-CM | POA: Insufficient documentation

## 2021-09-10 DIAGNOSIS — R109 Unspecified abdominal pain: Secondary | ICD-10-CM | POA: Insufficient documentation

## 2021-09-10 DIAGNOSIS — R0602 Shortness of breath: Secondary | ICD-10-CM | POA: Diagnosis not present

## 2021-09-10 DIAGNOSIS — M545 Low back pain, unspecified: Secondary | ICD-10-CM | POA: Insufficient documentation

## 2021-09-10 DIAGNOSIS — J020 Streptococcal pharyngitis: Secondary | ICD-10-CM | POA: Insufficient documentation

## 2021-09-10 DIAGNOSIS — J029 Acute pharyngitis, unspecified: Secondary | ICD-10-CM | POA: Diagnosis present

## 2021-09-10 LAB — CBC WITH DIFFERENTIAL/PLATELET
Abs Immature Granulocytes: 0.04 10*3/uL (ref 0.00–0.07)
Basophils Absolute: 0 10*3/uL (ref 0.0–0.1)
Basophils Relative: 0 %
Eosinophils Absolute: 0 10*3/uL (ref 0.0–0.5)
Eosinophils Relative: 0 %
HCT: 37.5 % (ref 36.0–46.0)
Hemoglobin: 12.3 g/dL (ref 12.0–15.0)
Immature Granulocytes: 0 %
Lymphocytes Relative: 9 %
Lymphs Abs: 0.9 10*3/uL (ref 0.7–4.0)
MCH: 27.8 pg (ref 26.0–34.0)
MCHC: 32.8 g/dL (ref 30.0–36.0)
MCV: 84.8 fL (ref 80.0–100.0)
Monocytes Absolute: 0.7 10*3/uL (ref 0.1–1.0)
Monocytes Relative: 6 %
Neutro Abs: 8.6 10*3/uL — ABNORMAL HIGH (ref 1.7–7.7)
Neutrophils Relative %: 85 %
Platelets: 394 10*3/uL (ref 150–400)
RBC: 4.42 MIL/uL (ref 3.87–5.11)
RDW: 13.8 % (ref 11.5–15.5)
WBC: 10.3 10*3/uL (ref 4.0–10.5)
nRBC: 0 % (ref 0.0–0.2)

## 2021-09-10 LAB — URINALYSIS, ROUTINE W REFLEX MICROSCOPIC
Bilirubin Urine: NEGATIVE
Glucose, UA: NEGATIVE mg/dL
Ketones, ur: NEGATIVE mg/dL
Leukocytes,Ua: NEGATIVE
Nitrite: NEGATIVE
Protein, ur: NEGATIVE mg/dL
Specific Gravity, Urine: 1.02 (ref 1.005–1.030)
pH: 7 (ref 5.0–8.0)

## 2021-09-10 LAB — COMPREHENSIVE METABOLIC PANEL
ALT: 17 U/L (ref 0–44)
AST: 23 U/L (ref 15–41)
Albumin: 3.6 g/dL (ref 3.5–5.0)
Alkaline Phosphatase: 101 U/L (ref 38–126)
Anion gap: 11 (ref 5–15)
BUN: 10 mg/dL (ref 6–20)
CO2: 24 mmol/L (ref 22–32)
Calcium: 8.7 mg/dL — ABNORMAL LOW (ref 8.9–10.3)
Chloride: 99 mmol/L (ref 98–111)
Creatinine, Ser: 0.64 mg/dL (ref 0.44–1.00)
GFR, Estimated: >60 mL/min (ref >60)
Glucose, Bld: 111 mg/dL — ABNORMAL HIGH (ref 70–99)
Potassium: 3.7 mmol/L (ref 3.5–5.1)
Sodium: 134 mmol/L — ABNORMAL LOW (ref 135–145)
Total Bilirubin: 0.6 mg/dL (ref 0.3–1.2)
Total Protein: 8.1 g/dL (ref 6.5–8.1)

## 2021-09-10 LAB — PREGNANCY, URINE: Preg Test, Ur: NEGATIVE

## 2021-09-10 LAB — GROUP A STREP BY PCR: Group A Strep by PCR: DETECTED — AB

## 2021-09-10 LAB — RESP PANEL BY RT-PCR (FLU A&B, COVID) ARPGX2
Influenza A by PCR: NEGATIVE
Influenza B by PCR: NEGATIVE
SARS Coronavirus 2 by RT PCR: NEGATIVE

## 2021-09-10 LAB — URINALYSIS, MICROSCOPIC (REFLEX)

## 2021-09-10 MED ORDER — METOCLOPRAMIDE HCL 5 MG/ML IJ SOLN
5.0000 mg | Freq: Once | INTRAMUSCULAR | Status: AC
  Administered 2021-09-10: 5 mg via INTRAVENOUS
  Filled 2021-09-10: qty 2

## 2021-09-10 MED ORDER — HYDROMORPHONE HCL 1 MG/ML IJ SOLN
0.5000 mg | Freq: Once | INTRAMUSCULAR | Status: AC
  Administered 2021-09-10: 0.5 mg via INTRAVENOUS
  Filled 2021-09-10: qty 1

## 2021-09-10 MED ORDER — ACETAMINOPHEN 500 MG PO TABS
1000.0000 mg | ORAL_TABLET | Freq: Four times a day (QID) | ORAL | Status: DC | PRN
  Administered 2021-09-10: 1000 mg via ORAL
  Filled 2021-09-10: qty 2

## 2021-09-10 MED ORDER — SODIUM CHLORIDE 0.9 % IV BOLUS
1000.0000 mL | Freq: Once | INTRAVENOUS | Status: AC
  Administered 2021-09-10: 1000 mL via INTRAVENOUS

## 2021-09-10 MED ORDER — ONDANSETRON 4 MG PO TBDP: 8.0000 mg | ORAL_TABLET | Freq: Once | ORAL | Status: DC

## 2021-09-10 MED ORDER — PENICILLIN G BENZATHINE 1200000 UNIT/2ML IM SUSY
1.2000 10*6.[IU] | PREFILLED_SYRINGE | Freq: Once | INTRAMUSCULAR | Status: AC
  Administered 2021-09-10: 1.2 10*6.[IU] via INTRAMUSCULAR
  Filled 2021-09-10: qty 2

## 2021-09-10 MED ORDER — ONDANSETRON HCL 4 MG/2ML IJ SOLN
4.0000 mg | Freq: Once | INTRAMUSCULAR | Status: AC
  Administered 2021-09-10: 4 mg via INTRAVENOUS
  Filled 2021-09-10: qty 2

## 2021-09-10 MED ORDER — KETOROLAC TROMETHAMINE 15 MG/ML IJ SOLN
15.0000 mg | Freq: Once | INTRAMUSCULAR | Status: AC
  Administered 2021-09-10: 15 mg via INTRAVENOUS
  Filled 2021-09-10: qty 1

## 2021-09-10 NOTE — Discharge Instructions (Addendum)
Return to ED with any new symptoms such as inability to swallow or chest pain ?Please follow-up with PCP for any ongoing issues ?You were treated with a one-time antibiotic shot here today.  You do not require any more antibiotics for your strep throat. ?I have written you out of work until the 10th. ?You may treat your symptoms at home utilizing Tylenol for headaches and fevers and ibuprofen for body aches and chills ?

## 2021-09-10 NOTE — ED Notes (Signed)
Patient transported to CT 

## 2021-09-10 NOTE — ED Notes (Addendum)
Pt presents with shortness of breath, n/v, dizziness and abd pain for 2x days, unrelieved at home. Chest and lower back pain present. Hx asthma.  ?

## 2021-09-10 NOTE — ED Provider Notes (Signed)
[facility] EMERGENCY DEPARTMENT Provider Note   CSN: [id number] Arrival date & time: 09/10/21  0820     History  Chief Complaint  Patient presents with   Influenza    Sonia Key is a 36 y.o. female with medical history of asthma, kidney stones, obesity. Patient presents to ED for evaluation of sore throat, headache, back pain x2 days, abdominal pain, dizziness, shortness of breath, nausea. Patient states her symptoms began 2 days ago and shortness of breath began this morning. Patient states symptoms have progressively worsened since onset 2 days ago. Denies fevers, sick contacts. Patient states she is fully vaccinated for COVID and the influenza. 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 Patient also reporting low back pain x2 days and states she has history of kidney stones. Patient states it has "been a while" since she has seen a urologist for kidney stones. Patient denies fevers, chest pain, cough, runny nose, diarrhea, dysuria.    Influenza Presenting symptoms: headache, nausea, shortness of breath and sore throat   Presenting symptoms: no cough, no diarrhea, no fever, no rhinorrhea and no vomiting       Home Medications Prior to Admission medications   Medication Sig Start Date End Date Taking? Authorizing Provider  sertraline (ZOLOFT) 50 MG tablet Take 50 mg by mouth daily.   Yes [provider]      Allergies    Ciprofloxacin    Review of Systems   Review of Systems  Constitutional:  Negative for fever.  HENT:  Positive for sore throat. Negative for rhinorrhea.   Respiratory:  Positive for shortness of breath. Negative for cough.   Cardiovascular:  Negative for chest pain.  Gastrointestinal:  Positive for abdominal pain and nausea. Negative for diarrhea and vomiting.  Genitourinary:  Negative for dysuria.  Musculoskeletal:  Positive for back pain.  Neurological:  Positive for dizziness and headaches.  All other systems  reviewed and are negative.  Physical Exam Updated Vital Signs BP 126/68    Pulse 89    Temp 100 F (37.8 C) (Oral)    Resp 19    Ht 5\' 10"  (1.778 m)    Wt (!) 172.8 kg    LMP 09/03/2021 (Exact Date) Comment: neg upreg today in er.   SpO2 99%    BMI 54.67 kg/m  Physical Exam Vitals and nursing note reviewed.  Constitutional:      General: She is not in acute distress.    Appearance: Normal appearance. She is obese. She is not ill-appearing, toxic-appearing or diaphoretic.  HENT:     Head: Normocephalic and atraumatic.     Nose: Nose normal. No congestion.     Mouth/Throat:     Mouth: Mucous membranes are moist.     Pharynx: Uvula midline. Oropharyngeal exudate and posterior oropharyngeal erythema present.     Tonsils: Tonsillar exudate present. No tonsillar abscesses. 1+ on the right. 1+ on the left.  Eyes:     Extraocular Movements: Extraocular movements intact.     Conjunctiva/sclera: Conjunctivae normal.     Pupils: Pupils are equal, round, and reactive to light.  Cardiovascular:     Rate and Rhythm: Normal rate and regular rhythm.  Pulmonary:     Effort: Pulmonary effort is normal. No respiratory distress.     Breath sounds: Normal breath sounds. No stridor. No wheezing, rhonchi or rales.  Abdominal:     General: Abdomen is flat. Bowel sounds are normal. There is no distension.  Palpations: Abdomen is soft.     Tenderness: There is no abdominal tenderness. There is no right CVA tenderness, left CVA tenderness or rebound.  Musculoskeletal:     Cervical back: Normal range of motion and neck supple. No rigidity or tenderness.  Skin:    General: Skin is warm and dry.     Capillary Refill: Capillary refill takes less than 2 seconds.  Neurological:     Mental Status: She is alert and oriented to person, place, and time.     GCS: GCS eye subscore is 4. GCS verbal subscore is 5. GCS motor subscore is 6.     Cranial Nerves: Cranial nerves 2-12 are intact. No cranial nerve deficit  or dysarthria.     Sensory: Sensation is intact. No sensory deficit.     Motor: Motor function is intact. No weakness or atrophy.     Coordination: Coordination is intact. Heel to [facility] Test normal.    ED Results / Procedures / Treatments   Labs (all labs ordered are listed, but only abnormal results are displayed) Labs Reviewed  GROUP A STREP BY PCR - Abnormal; Notable for the following components:      Result Value   Group A Strep by PCR DETECTED (*)    All other components within normal limits  URINALYSIS, ROUTINE W REFLEX MICROSCOPIC - Abnormal; Notable for the following components:   APPearance CLOUDY (*)    Hgb urine dipstick TRACE (*)    All other components within normal limits  URINALYSIS, MICROSCOPIC (REFLEX) - Abnormal; Notable for the following components:   Bacteria, UA RARE (*)    All other components within normal limits  CBC WITH DIFFERENTIAL/PLATELET - Abnormal; Notable for the following components:   Neutro Abs 8.6 (*)    All other components within normal limits  COMPREHENSIVE METABOLIC PANEL - Abnormal; Notable for the following components:   Sodium 134 (*)    Glucose, Bld 111 (*)    Calcium 8.7 (*)    All other components within normal limits  RESP PANEL BY RT-PCR (FLU A&B, COVID) ARPGX2  PREGNANCY, URINE    EKG None  Radiology CT Renal Stone Study  Result Date: 09/10/2021 CLINICAL DATA:  Flank pain, kidney stone suspected EXAM: CT ABDOMEN AND PELVIS WITHOUT CONTRAST TECHNIQUE: Multidetector CT imaging of the abdomen and pelvis was performed following the standard protocol without IV contrast. RADIATION DOSE REDUCTION: This exam was performed according to the departmental dose-optimization program which includes automated exposure control, adjustment of the mA and/or kV according to patient size and/or use of iterative reconstruction technique. COMPARISON:  2018 FINDINGS: Lower chest: No acute abnormality. Hepatobiliary: No focal liver abnormality is seen.  Status post cholecystectomy. No biliary dilatation. Pancreas: Unremarkable. Spleen: Unremarkable. Adrenals/Urinary Tract: Adrenals are unremarkable. There are small bilateral nonobstructing renal calculi measuring up to 4 mm. No hydronephrosis. Bladder is poorly distended and not well evaluated. Stomach/Bowel: Stomach is within normal limits. Bowel is normal in caliber. Appendix is not definitely identified. Vascular/Lymphatic: No significant vascular abnormality on this noncontrast study. Reproductive: Uterus and bilateral adnexa are unremarkable. Other: No free fluid.  No acute abnormality of the abdominal wall. Musculoskeletal: No acute osseous abnormality. IMPRESSION: Small bilateral nonobstructing renal calculi. Electronically Signed   By: Guadlupe Spanish M.D.   On: 09/10/2021 11:06    Procedures Procedures    Medications Ordered in ED Medications  acetaminophen (TYLENOL) tablet 1,000 mg (1,000 mg Oral Given 09/10/21 1105)  sodium chloride 0.9 % bolus 1,000 mL (0  mLs Intravenous Stopped 09/10/21 1249)  ondansetron (ZOFRAN) injection 4 mg (4 mg Intravenous Given 09/10/21 1053)  HYDROmorphone (DILAUDID) injection 0.5 mg (0.5 mg Intravenous Given 09/10/21 1054)  penicillin g benzathine (BICILLIN LA) 1200000 UNIT/2ML injection 1.2 Million Units (1.2 Million Units Intramuscular Given 09/10/21 1152)  ketorolac (TORADOL) 15 MG/ML injection 15 mg (15 mg Intravenous Given 09/10/21 1150)  metoCLOPramide (REGLAN) injection 5 mg (5 mg Intravenous Given 09/10/21 1247)    ED Course/ Medical Decision Making/ A&P                           Medical Decision Making Amount and/or Complexity of Data Reviewed Labs: ordered. Radiology: ordered.  Risk OTC drugs. Prescription drug management.   36 year old female presents to ED for evaluation of sore throat along with multitude of other complaints.  Please see HPI for further details. On examination, the patient is febrile, nontachycardic, nonhypoxic, clear lung sounds  bilaterally, soft compressible abdomen.  The patient has no CVA tenderness on examination. The patient's posterior oropharynx is erythematous, she has 1+ tonsillar swelling bilaterally as well as exudates, she is handling secretions appropriately, no hot potato voice, no signs of respiratory distress.  Patient denies cough.  Patient worked up utilizing following labs and imaging studies interpreted by me personally: - CMP unremarkable - CBC unremarkable - Urinalysis shows trace hemoglobin - Group A strep positive.  Patient treated with one-time shot penicillin G benzathine 1,200,000 units. - Respiratory panel negative for COVID and flu - Pregnancy test negative - CT renal stone study shows multiple bilateral nonobstructing renal stones   Patient treated utilizing following medications: 1 L normal saline, 5 mg Reglan, 50 mg Toradol, 0.5 mg Dilaudid, 4 mg Zofran, 1000 mg Tylenol.  Patient received 1 L normal saline due to the fact that she is reported decreased oral intake over the last 2 days.  Patient received Toradol and Reglan for headache, she states after administration of these medication she feels much better.  Patient received 0.5 mg Dilaudid due to suspected kidney stone as well as 4 mg Zofran for nausea.  Patient received 1000 mg Tylenol for fever.  At this time, the patient seems stable for discharge.  Patient reports he feels much better after treatment.  Patient advised to follow-up with PCP for any ongoing concerns.  Patient case discussed with Dr. Stevie Kern who is in agreement with plan for management.  Patient provided with return precautions and she voiced understanding.  Patient had all of her questions answered to her satisfaction.  Patient stable at this time.  Final Clinical Impression(s) / ED Diagnoses Final diagnoses:  Strep pharyngitis    Rx / DC Orders ED Discharge Orders     None         Al Decant, PA-C 09/10/21 1304    Milagros Loll,  MD 09/12/21 1154

## 2021-09-10 NOTE — ED Triage Notes (Signed)
Head ache x 2 days bilateral lower  back pain  sore throat  dizziness when walking body aches ?

## 2023-03-21 IMAGING — CT CT RENAL STONE PROTOCOL
2 of 4 series · 17 of 46 positions shown, 19 images · non-contrast
Comparison: 4380

CLINICAL DATA: Flank pain, kidney stone suspected



[Series 2: axial st · axial · 0.98mm/px · z∈[-511,-56]mm · 14 of 101 slices shown, 16 images]
[im 5/101  soft-tissue]
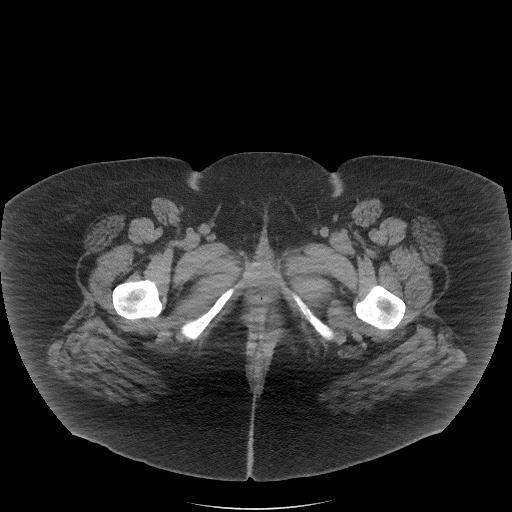
[im 5/101  bone]
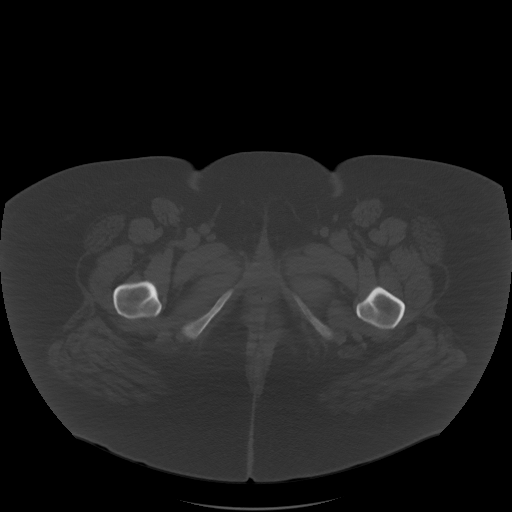
[im 13/101  soft-tissue]
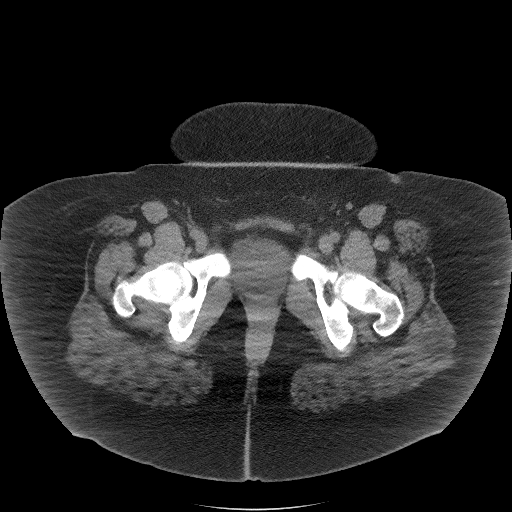
[im 21/101  soft-tissue]
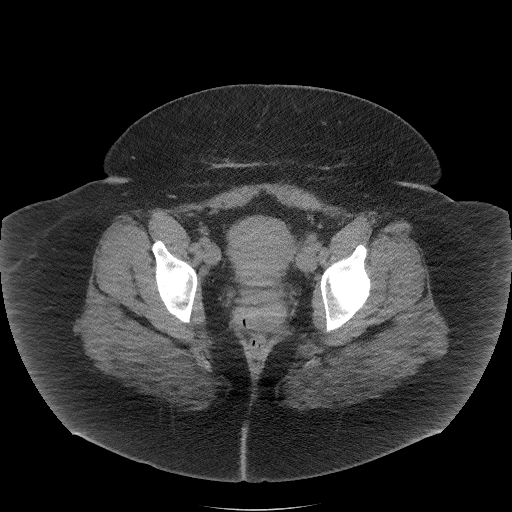
[im 26/101  soft-tissue]
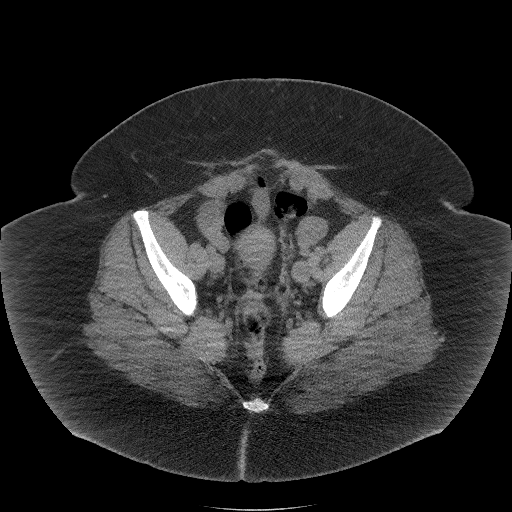
[im 34/101  soft-tissue]
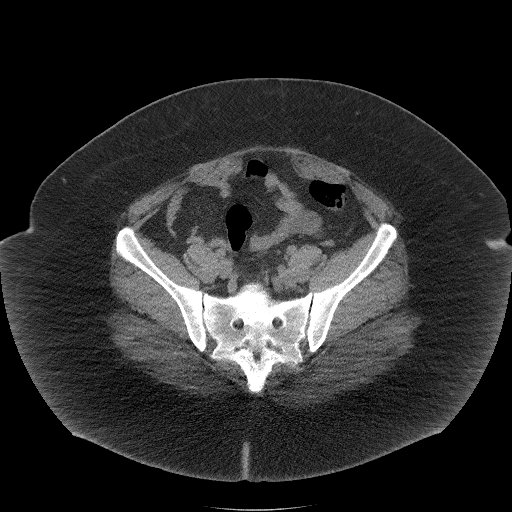
[im 42/101  soft-tissue]
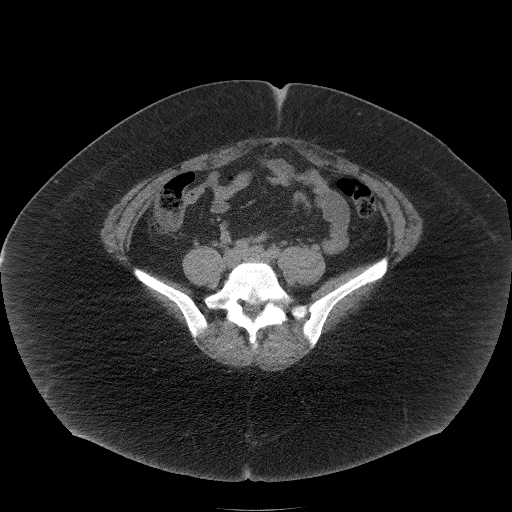
[im 46/101  soft-tissue]
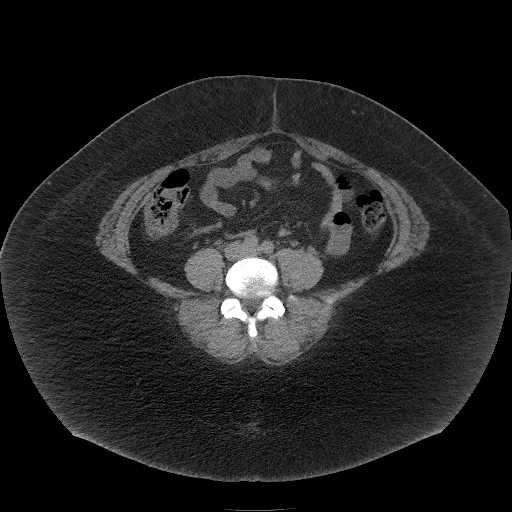
[im 55/101  soft-tissue]
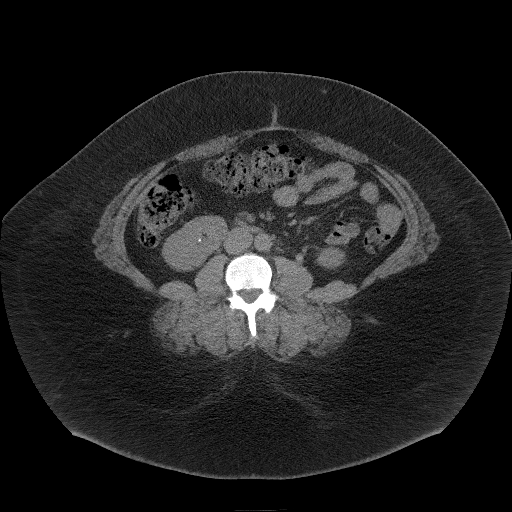
[im 59/101  soft-tissue]
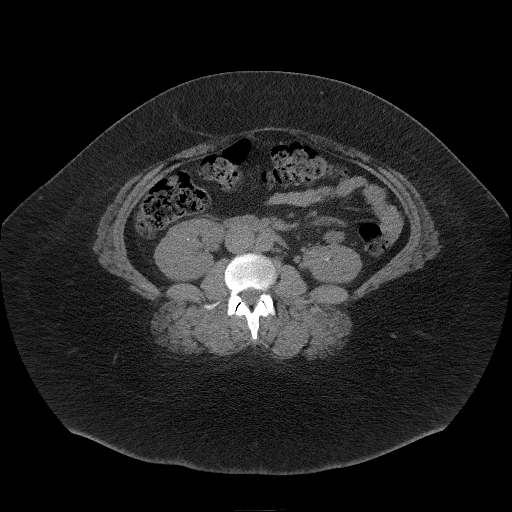
[im 59/101  bone]
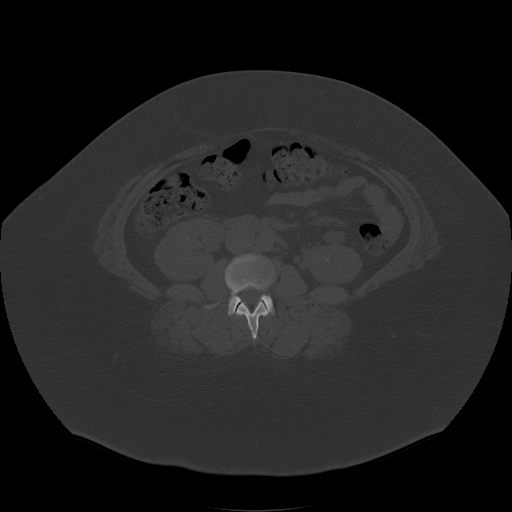
[im 67/101  soft-tissue]
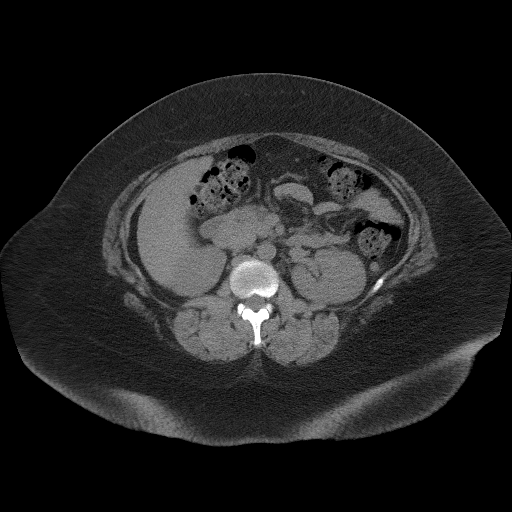
[im 76/101  soft-tissue]
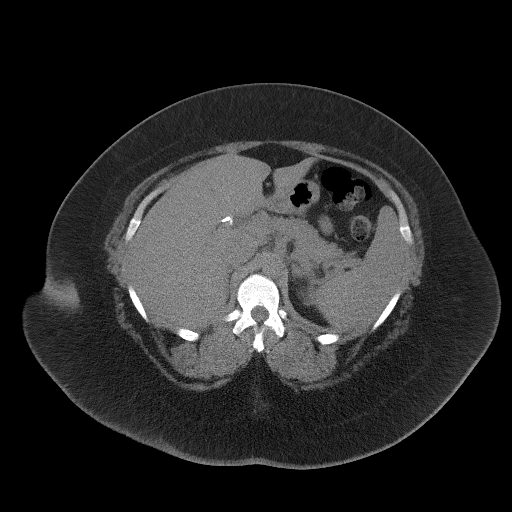
[im 80/101  soft-tissue]
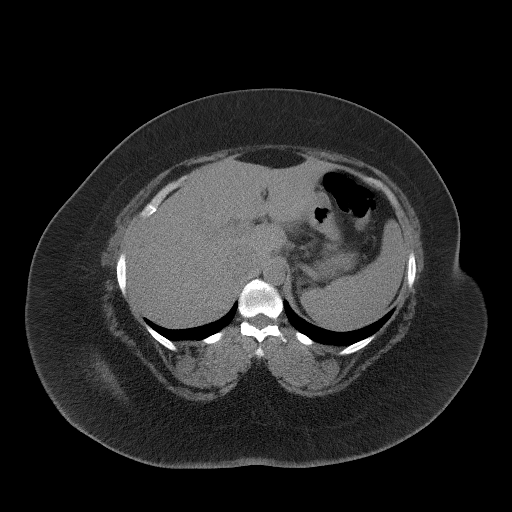
[im 88/101  soft-tissue]
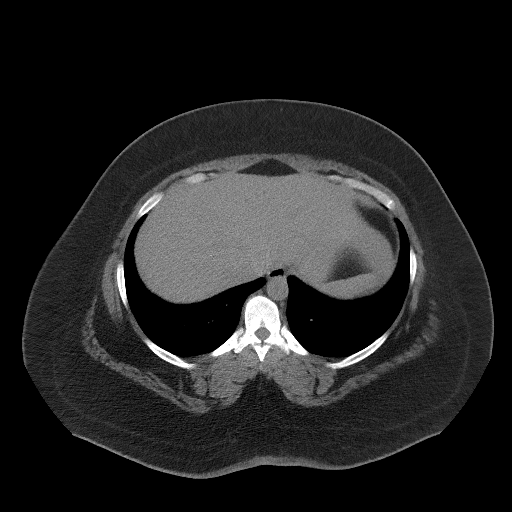
[im 96/101  soft-tissue]
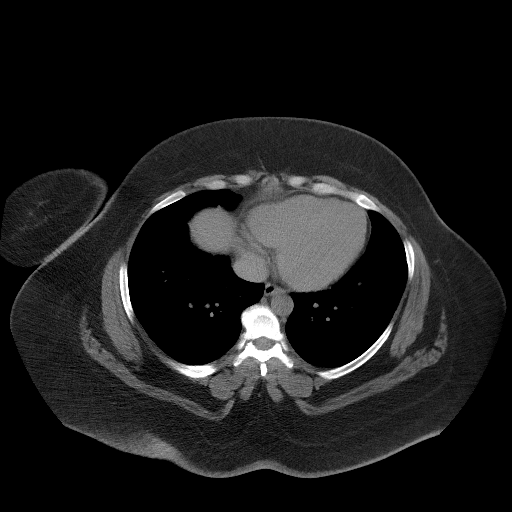

[Series 5: coronal st · coronal · 1.06mm/px · 3 of 130 slices shown]
[im 44/130  soft-tissue]
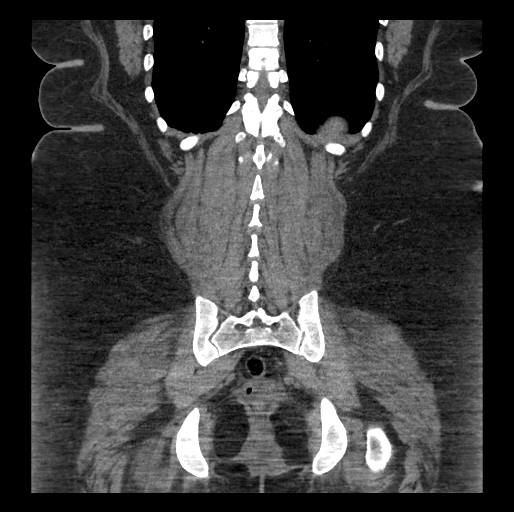
[im 58/130  soft-tissue]
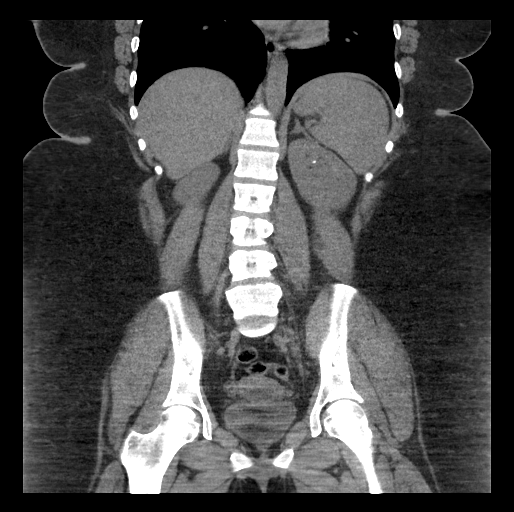
[im 72/130  soft-tissue]
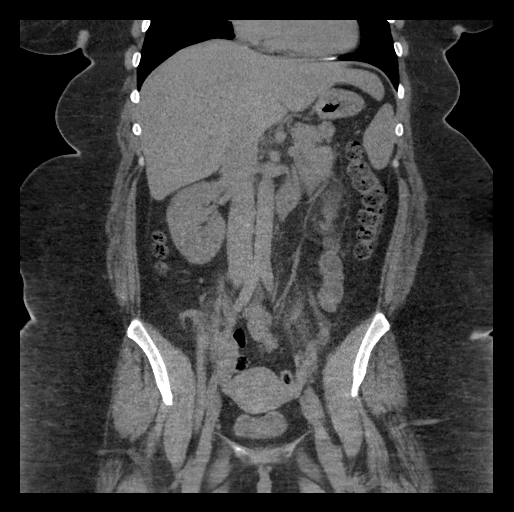

[17 of 46 positions shown; findings below may reference images not displayed]

FINDINGS: Lower chest: No acute abnormality.

Hepatobiliary: No focal liver abnormality is seen. Status post
cholecystectomy. No biliary dilatation.

Pancreas: Unremarkable.

Spleen: Unremarkable.

Adrenals/Urinary Tract: Adrenals are unremarkable. There are small
bilateral nonobstructing renal calculi measuring up to 4 mm. No
hydronephrosis. Bladder is poorly distended and not well evaluated.

Stomach/Bowel: Stomach is within normal limits. Bowel is normal in
caliber. Appendix is not definitely identified.

Vascular/Lymphatic: No significant vascular abnormality on this
noncontrast study.

Reproductive: Uterus and bilateral adnexa are unremarkable.

Other: No free fluid.  No acute abnormality of the abdominal wall.

Musculoskeletal: No acute osseous abnormality.
IMPRESSION: Small bilateral nonobstructing renal calculi.

## 2023-04-15 ENCOUNTER — Encounter (HOSPITAL_BASED_OUTPATIENT_CLINIC_OR_DEPARTMENT_OTHER): Payer: Self-pay

## 2023-04-15 ENCOUNTER — Emergency Department (HOSPITAL_BASED_OUTPATIENT_CLINIC_OR_DEPARTMENT_OTHER)
Admission: EM | Admit: 2023-04-15 | Discharge: 2023-04-15 | Discharge status: Against Medical Advice | Payer: Medicaid Other | Attending: Emergency Medicine | Admitting: Emergency Medicine

## 2023-04-15 ENCOUNTER — Other Ambulatory Visit: Payer: Self-pay

## 2023-04-15 DIAGNOSIS — Z5321 Procedure and treatment not carried out due to patient leaving prior to being seen by health care provider: Secondary | ICD-10-CM | POA: Insufficient documentation

## 2023-04-15 DIAGNOSIS — M545 Low back pain, unspecified: Secondary | ICD-10-CM | POA: Diagnosis present

## 2023-04-15 LAB — COMPREHENSIVE METABOLIC PANEL
ALT: 24 U/L (ref 0–44)
AST: 23 U/L (ref 15–41)
Albumin: 3.6 g/dL (ref 3.5–5.0)
Alkaline Phosphatase: 103 U/L (ref 38–126)
Anion gap: 13 (ref 5–15)
BUN: 15 mg/dL (ref 6–20)
CO2: 24 mmol/L (ref 22–32)
Calcium: 9.2 mg/dL (ref 8.9–10.3)
Chloride: 101 mmol/L (ref 98–111)
Creatinine, Ser: 0.73 mg/dL (ref 0.44–1.00)
GFR, Estimated: >60 mL/min (ref >60)
Glucose, Bld: 108 mg/dL — ABNORMAL HIGH (ref 70–99)
Potassium: 3.5 mmol/L (ref 3.5–5.1)
Sodium: 138 mmol/L (ref 135–145)
Total Bilirubin: 0.6 mg/dL (ref 0.3–1.2)
Total Protein: 8.1 g/dL (ref 6.5–8.1)

## 2023-04-15 LAB — URINALYSIS, ROUTINE W REFLEX MICROSCOPIC
Bilirubin Urine: NEGATIVE
Glucose, UA: NEGATIVE mg/dL
Hgb urine dipstick: NEGATIVE
Ketones, ur: NEGATIVE mg/dL
Leukocytes,Ua: NEGATIVE
Nitrite: NEGATIVE
Protein, ur: NEGATIVE mg/dL
Specific Gravity, Urine: 1.025 (ref 1.005–1.030)
pH: 6 (ref 5.0–8.0)

## 2023-04-15 LAB — CBC
HCT: 33.6 % — ABNORMAL LOW (ref 36.0–46.0)
Hemoglobin: 10.4 g/dL — ABNORMAL LOW (ref 12.0–15.0)
MCH: 25.1 pg — ABNORMAL LOW (ref 26.0–34.0)
MCHC: 31 g/dL (ref 30.0–36.0)
MCV: 81.2 fL (ref 80.0–100.0)
Platelets: 403 10*3/uL — ABNORMAL HIGH (ref 150–400)
RBC: 4.14 MIL/uL (ref 3.87–5.11)
RDW: 14.4 % (ref 11.5–15.5)
WBC: 5.6 10*3/uL (ref 4.0–10.5)
nRBC: 0 % (ref 0.0–0.2)

## 2023-04-15 LAB — PREGNANCY, URINE: Preg Test, Ur: NEGATIVE

## 2023-04-15 LAB — LIPASE, BLOOD: Lipase: 24 U/L (ref 11–51)

## 2023-04-15 MED ORDER — ONDANSETRON 4 MG PO TBDP
4.0000 mg | ORAL_TABLET | Freq: Once | ORAL | Status: AC | PRN
  Administered 2023-04-15: 4 mg via ORAL
  Filled 2023-04-15: qty 1

## 2023-04-15 NOTE — ED Triage Notes (Signed)
Patient reports back pain that she thinks is related to a kidney stone. Patient has had kidney stones in the pass. Also reports nausea and some stomach pain. 10/10. Pain began yesterday. Patient took a Goody powder, tylenol and ibuprofen.

## 2024-02-08 ENCOUNTER — Emergency Department (HOSPITAL_BASED_OUTPATIENT_CLINIC_OR_DEPARTMENT_OTHER)
Admission: EM | Admit: 2024-02-08 | Discharge: 2024-02-09 | Disposition: A | Discharge status: Home / Self Care | Attending: Emergency Medicine | Admitting: Emergency Medicine

## 2024-02-08 ENCOUNTER — Encounter (HOSPITAL_BASED_OUTPATIENT_CLINIC_OR_DEPARTMENT_OTHER): Payer: Self-pay | Admitting: Emergency Medicine

## 2024-02-08 DIAGNOSIS — J4521 Mild intermittent asthma with (acute) exacerbation: Secondary | ICD-10-CM | POA: Insufficient documentation

## 2024-02-08 DIAGNOSIS — R0602 Shortness of breath: Secondary | ICD-10-CM | POA: Diagnosis present

## 2024-02-08 HISTORY — DX: Anemia, unspecified: D64.9

## 2024-02-08 LAB — RESP PANEL BY RT-PCR (RSV, FLU A&B, COVID)  RVPGX2
Influenza A by PCR: NEGATIVE
Influenza B by PCR: NEGATIVE
Resp Syncytial Virus by PCR: NEGATIVE
SARS Coronavirus 2 by RT PCR: NEGATIVE

## 2024-02-08 NOTE — ED Triage Notes (Signed)
 Pt reports SHOB since yesterday; not getting relief from albuterol  inhaler; also c/o body aches; sts she has been getting B12 shots for anemia; RT in to assess

## 2024-02-09 ENCOUNTER — Emergency Department (HOSPITAL_BASED_OUTPATIENT_CLINIC_OR_DEPARTMENT_OTHER)

## 2024-02-09 LAB — CBC WITH DIFFERENTIAL/PLATELET
Abs Immature Granulocytes: 0.02 K/uL (ref 0.00–0.07)
Basophils Absolute: 0 K/uL (ref 0.0–0.1)
Basophils Relative: 1 %
Eosinophils Absolute: 0.3 K/uL (ref 0.0–0.5)
Eosinophils Relative: 4 %
HCT: 35 % — ABNORMAL LOW (ref 36.0–46.0)
Hemoglobin: 11 g/dL — ABNORMAL LOW (ref 12.0–15.0)
Immature Granulocytes: 0 %
Lymphocytes Relative: 39 %
Lymphs Abs: 2.6 K/uL (ref 0.7–4.0)
MCH: 25.2 pg — ABNORMAL LOW (ref 26.0–34.0)
MCHC: 31.4 g/dL (ref 30.0–36.0)
MCV: 80.1 fL (ref 80.0–100.0)
Monocytes Absolute: 0.5 K/uL (ref 0.1–1.0)
Monocytes Relative: 8 %
Neutro Abs: 3.2 K/uL (ref 1.7–7.7)
Neutrophils Relative %: 48 %
Platelets: 350 K/uL (ref 150–400)
RBC: 4.37 MIL/uL (ref 3.87–5.11)
RDW: 15 % (ref 11.5–15.5)
WBC: 6.6 K/uL (ref 4.0–10.5)
nRBC: 0 % (ref 0.0–0.2)

## 2024-02-09 LAB — URINALYSIS, ROUTINE W REFLEX MICROSCOPIC
Bilirubin Urine: NEGATIVE
Glucose, UA: NEGATIVE mg/dL
Hgb urine dipstick: NEGATIVE
Ketones, ur: NEGATIVE mg/dL
Leukocytes,Ua: NEGATIVE
Nitrite: NEGATIVE
Protein, ur: NEGATIVE mg/dL
Specific Gravity, Urine: 1.02 (ref 1.005–1.030)
pH: 6 (ref 5.0–8.0)

## 2024-02-09 LAB — COMPREHENSIVE METABOLIC PANEL WITH GFR
ALT: 10 U/L (ref 0–44)
AST: 20 U/L (ref 15–41)
Albumin: 4.1 g/dL (ref 3.5–5.0)
Alkaline Phosphatase: 122 U/L (ref 38–126)
Anion gap: 12 (ref 5–15)
BUN: 12 mg/dL (ref 6–20)
CO2: 24 mmol/L (ref 22–32)
Calcium: 9.2 mg/dL (ref 8.9–10.3)
Chloride: 104 mmol/L (ref 98–111)
Creatinine, Ser: 0.66 mg/dL (ref 0.44–1.00)
GFR, Estimated: >60 mL/min (ref >60)
Glucose, Bld: 109 mg/dL — ABNORMAL HIGH (ref 70–99)
Potassium: 3.5 mmol/L (ref 3.5–5.1)
Sodium: 140 mmol/L (ref 135–145)
Total Bilirubin: 0.3 mg/dL (ref 0.0–1.2)
Total Protein: 7.7 g/dL (ref 6.5–8.1)

## 2024-02-09 LAB — PRO BRAIN NATRIURETIC PEPTIDE: Pro Brain Natriuretic Peptide: 123 pg/mL (ref <300.0)

## 2024-02-09 LAB — PREGNANCY, URINE: Preg Test, Ur: NEGATIVE

## 2024-02-09 MED ORDER — LISINOPRIL 10 MG PO TABS
20.0000 mg | ORAL_TABLET | Freq: Once | ORAL | Status: AC
  Administered 2024-02-09: 20 mg via ORAL
  Filled 2024-02-09: qty 2

## 2024-02-09 MED ORDER — IPRATROPIUM-ALBUTEROL 0.5-2.5 (3) MG/3ML IN SOLN
3.0000 mL | Freq: Once | RESPIRATORY_TRACT | Status: AC
  Administered 2024-02-09: 3 mL via RESPIRATORY_TRACT
  Filled 2024-02-09: qty 3

## 2024-02-09 MED ORDER — PREDNISONE 20 MG PO TABS: 40.0000 mg | ORAL_TABLET | Freq: Every day | ORAL | 0 refills | Status: AC

## 2024-02-09 NOTE — ED Notes (Signed)
 Pt ambulated around dept on room air , HR 98-100 SATS at 100%, no distress noted

## 2024-02-09 NOTE — ED Provider Notes (Addendum)
 [place] [facility] Provider Note  CSN: [id number] Arrival date & time: 02/08/24 2140  Chief Complaint(s) Shortness of Breath  HPI Sonia Key is a 38 y.o. female    The history is provided by the patient.  Shortness of Breath Severity:  Moderate Onset quality:  Gradual Progression:  Worsening Chronicity:  Recurrent Context: activity   Relieved by:  Inhaler Worsened by:  Exertion and activity Associated symptoms: no chest pain, no cough, no fever, no sputum production and no wheezing   Risk factors: obesity   Risk factors: no hx of cancer, no hx of PE/DVT, no oral contraceptive use, no prolonged immobilization and no recent surgery     Past Medical History Past Medical History:  Diagnosis Date   Anemia    Asthma    Bartholin cyst    Kidney stones    Obesity    There are no active problems to display for this patient.  Home Medication(s) Prior to Admission medications   Medication Sig Start Date End Date Taking? Authorizing Provider  predniSONE  (DELTASONE ) 20 MG tablet Take 2 tablets (40 mg total) by mouth daily with breakfast for 4 days. 02/09/24 02/13/24 Yes Alleta Avery, Raynell Moder, MD  sertraline (ZOLOFT) 50 MG tablet Take 50 mg by mouth daily.    [provider]                                                                                                                                    Allergies Ciprofloxacin   Review of Systems Review of Systems  Constitutional:  Negative for fever.  Respiratory:  Positive for shortness of breath. Negative for cough, sputum production and wheezing.   Cardiovascular:  Negative for chest pain.   As noted in HPI  Physical Exam Vital Signs  I have reviewed the triage vital signs BP (!) 175/96   Pulse 69   Temp 98.9 F (37.2 C)   Resp 13   Ht 5' 10 (1.778 m)   Wt (!) 163.3 kg   LMP 01/23/2024   SpO2 98%   BMI 51.65 kg/m   Physical Exam Vitals reviewed.   Constitutional:      General: She is not in acute distress.    Appearance: She is well-developed. She is obese. She is not diaphoretic.  HENT:     Head: Normocephalic and atraumatic.     Nose: Nose normal.  Eyes:     General: No scleral icterus.       Right eye: No discharge.        Left eye: No discharge.     Conjunctiva/sclera: Conjunctivae normal.     Pupils: Pupils are equal, round, and reactive to light.  Cardiovascular:     Rate and Rhythm: Normal rate and regular rhythm.     Heart sounds: No murmur heard.    No friction rub. No gallop.  Pulmonary:  Effort: Pulmonary effort is normal. No respiratory distress.     Breath sounds: Decreased air movement present. No stridor. No wheezing, rhonchi or rales.  Abdominal:     General: There is no distension.     Palpations: Abdomen is soft.     Tenderness: There is no abdominal tenderness.  Musculoskeletal:        General: No tenderness.     Cervical back: Normal range of motion and neck supple.  Skin:    General: Skin is warm and dry.     Findings: No erythema or rash.  Neurological:     Mental Status: She is alert and oriented to person, place, and time.     ED Results and Treatments Labs (all labs ordered are listed, but only abnormal results are displayed) Labs Reviewed  CBC WITH DIFFERENTIAL/PLATELET - Abnormal; Notable for the following components:      Result Value   Hemoglobin 11.0 (*)    HCT 35.0 (*)    MCH 25.2 (*)    All other components within normal limits  COMPREHENSIVE METABOLIC PANEL WITH GFR - Abnormal; Notable for the following components:   Glucose, Bld 109 (*)    All other components within normal limits  RESP PANEL BY RT-PCR (RSV, FLU A&B, COVID)  RVPGX2  PREGNANCY, URINE  URINALYSIS, ROUTINE W REFLEX MICROSCOPIC  PRO BRAIN NATRIURETIC PEPTIDE                                                                                                                         EKG   Radiology DG Chest  Port 1 View Result Date: 02/09/2024 CLINICAL DATA:  Headache with dizziness and body aches. EXAM: PORTABLE CHEST 1 VIEW COMPARISON:  September 19, 2022 FINDINGS: The heart size and mediastinal contours are within normal limits. Both lungs are clear. The visualized skeletal structures are unremarkable. IMPRESSION: No active disease. Electronically Signed   By: Suzen Dials M.D.   On: 02/09/2024 00:52    Medications Ordered in ED Medications  lisinopril  (ZESTRIL ) tablet 20 mg (20 mg Oral Given 02/09/24 0037)  ipratropium-albuterol  (DUONEB) 0.5-2.5 (3) MG/3ML nebulizer solution 3 mL (3 mLs Nebulization Given 02/09/24 0044)   Procedures Procedures  (including critical care time) Medical Decision Making / ED Course   Medical Decision Making Amount and/or Complexity of Data Reviewed Labs: ordered. Radiology: ordered.  Risk Prescription drug management.    Shortness of breath Differential diagnosis considered  DuoNeb for possible mild asthma exacerbation. Chest x-ray without evidence of pneumonia, pneumothorax, pulmonary edema pleural effusions. BNP not concerning for heart failure CBC without leukocytosis.  Mild anemia with stable hemoglobin. CMP without significant electrolyte derangements or renal insufficiency.  After single DuoNeb, patient reported significant relief.  Able to ambulate without complication, satting 100% on room air without tachycardia.  Doubt pulmonary embolism  Patient was noted to have elevated blood pressures and reports that she has not taken her lisinopril  in 2 days.  Given her home dose here.  No evidence of angioedema or anaphylaxis.     Final Clinical Impression(s) / ED Diagnoses Final diagnoses:  Mild intermittent asthma with exacerbation   The patient appears reasonably screened and/or stabilized for discharge and I doubt any other medical condition or other [facility] requiring further screening, evaluation, or treatment in the ED at this time. I have  discussed the findings, Dx and Tx plan with the patient/family who expressed understanding and agree(s) with the plan. Discharge instructions discussed at length. The patient/family was given strict return precautions who verbalized understanding of the instructions. No further questions at time of discharge.  Disposition: Discharge  Condition: Good  ED Discharge Orders          Ordered    predniSONE  (DELTASONE ) 20 MG tablet  Daily with breakfast        02/09/24 0346            Follow Up: Deane Camie HERO., MD [address] [place] [address] [phone]  Call  to schedule an appointment for close follow up    This chart was dictated using voice recognition software.  Despite best efforts to proofread,  errors can occur which can change the documentation meaning.      Trine Raynell Moder, MD 02/09/24 [phone]
